# Patient Record
Sex: Female | Born: 1965
Health system: Southern US, Community
[De-identification: ages and names within clinical notes are randomized; demographics above are authoritative.]

## PROBLEM LIST (undated history)

## (undated) DIAGNOSIS — E8881 Metabolic syndrome: Secondary | ICD-10-CM

## (undated) DIAGNOSIS — E785 Hyperlipidemia, unspecified: Secondary | ICD-10-CM

## (undated) DIAGNOSIS — E119 Type 2 diabetes mellitus without complications: Secondary | ICD-10-CM

## (undated) DIAGNOSIS — J398 Other specified diseases of upper respiratory tract: Secondary | ICD-10-CM

## (undated) DIAGNOSIS — Z9889 Other specified postprocedural states: Secondary | ICD-10-CM

## (undated) DIAGNOSIS — I10 Essential (primary) hypertension: Secondary | ICD-10-CM

## (undated) DIAGNOSIS — R112 Nausea with vomiting, unspecified: Secondary | ICD-10-CM

## (undated) HISTORY — PX: TONSILLECTOMY: SUR1361

## (undated) HISTORY — PX: OTHER SURGICAL HISTORY: SHX169

## (undated) HISTORY — PX: ADENOIDECTOMY: SUR15

## (undated) HISTORY — DX: Other specified diseases of upper respiratory tract: J39.8

## (undated) HISTORY — DX: Type 2 diabetes mellitus without complications: E11.9

## (undated) HISTORY — DX: Metabolic syndrome: E88.810

## (undated) HISTORY — PX: VAGINAL HYSTERECTOMY: SUR661

## (undated) HISTORY — DX: Metabolic syndrome: E88.81

## (undated) HISTORY — DX: Hyperlipidemia, unspecified: E78.5

## (undated) HISTORY — PX: TUBAL LIGATION: SHX77

## (undated) HISTORY — PX: CLEFT PALATE REPAIR: SUR1165

## (undated) HISTORY — DX: Essential (primary) hypertension: I10

---

## 1998-09-04 ENCOUNTER — Other Ambulatory Visit: Admission: RE | Admit: 1998-09-04 | Discharge: 1998-09-04 | Payer: Self-pay | Admitting: Obstetrics and Gynecology

## 2000-10-27 ENCOUNTER — Other Ambulatory Visit: Admission: RE | Admit: 2000-10-27 | Discharge: 2000-10-27 | Payer: Self-pay | Admitting: Obstetrics and Gynecology

## 2001-11-20 ENCOUNTER — Other Ambulatory Visit: Admission: RE | Admit: 2001-11-20 | Discharge: 2001-11-20 | Payer: Self-pay | Admitting: Obstetrics and Gynecology

## 2003-02-28 ENCOUNTER — Other Ambulatory Visit: Admission: RE | Admit: 2003-02-28 | Discharge: 2003-02-28 | Payer: Self-pay | Admitting: Obstetrics and Gynecology

## 2003-05-06 ENCOUNTER — Observation Stay (HOSPITAL_COMMUNITY): Admission: RE | Admit: 2003-05-06 | Discharge: 2003-05-07 | Payer: Self-pay | Admitting: Obstetrics and Gynecology

## 2003-05-06 ENCOUNTER — Encounter (INDEPENDENT_AMBULATORY_CARE_PROVIDER_SITE_OTHER): Payer: Self-pay | Admitting: Specialist

## 2003-05-12 ENCOUNTER — Observation Stay (HOSPITAL_COMMUNITY): Admission: AD | Admit: 2003-05-12 | Discharge: 2003-05-13 | Payer: Self-pay | Admitting: Obstetrics and Gynecology

## 2003-05-23 ENCOUNTER — Inpatient Hospital Stay (HOSPITAL_COMMUNITY): Admission: AD | Admit: 2003-05-23 | Discharge: 2003-05-27 | Payer: Self-pay | Admitting: Obstetrics and Gynecology

## 2003-05-25 ENCOUNTER — Encounter: Payer: Self-pay | Admitting: Obstetrics and Gynecology

## 2004-04-04 ENCOUNTER — Other Ambulatory Visit: Admission: RE | Admit: 2004-04-04 | Discharge: 2004-04-04 | Payer: Self-pay | Admitting: Obstetrics and Gynecology

## 2005-04-19 ENCOUNTER — Other Ambulatory Visit: Admission: RE | Admit: 2005-04-19 | Discharge: 2005-04-19 | Payer: Self-pay | Admitting: Obstetrics and Gynecology

## 2008-05-16 ENCOUNTER — Ambulatory Visit: Payer: Self-pay | Admitting: Cardiovascular Disease

## 2008-05-17 ENCOUNTER — Observation Stay (HOSPITAL_COMMUNITY): Admission: EM | Admit: 2008-05-17 | Discharge: 2008-05-17 | Payer: Self-pay | Admitting: Emergency Medicine

## 2008-05-23 ENCOUNTER — Encounter: Payer: Self-pay | Admitting: Cardiology

## 2008-05-23 ENCOUNTER — Ambulatory Visit: Payer: Self-pay | Admitting: Cardiology

## 2008-05-23 DIAGNOSIS — R002 Palpitations: Secondary | ICD-10-CM

## 2008-05-23 DIAGNOSIS — I10 Essential (primary) hypertension: Secondary | ICD-10-CM

## 2008-05-31 ENCOUNTER — Encounter: Payer: Self-pay | Admitting: Cardiology

## 2008-05-31 ENCOUNTER — Ambulatory Visit: Payer: Self-pay

## 2008-06-04 DIAGNOSIS — E785 Hyperlipidemia, unspecified: Secondary | ICD-10-CM | POA: Insufficient documentation

## 2008-06-04 DIAGNOSIS — F411 Generalized anxiety disorder: Secondary | ICD-10-CM | POA: Insufficient documentation

## 2008-06-09 ENCOUNTER — Emergency Department (HOSPITAL_COMMUNITY): Admission: EM | Admit: 2008-06-09 | Discharge: 2008-06-09 | Payer: Self-pay | Admitting: Emergency Medicine

## 2008-08-23 ENCOUNTER — Ambulatory Visit: Payer: Self-pay | Admitting: Cardiology

## 2008-12-16 ENCOUNTER — Encounter (INDEPENDENT_AMBULATORY_CARE_PROVIDER_SITE_OTHER): Payer: Self-pay | Admitting: *Deleted

## 2009-02-21 ENCOUNTER — Ambulatory Visit: Payer: Self-pay | Admitting: Cardiology

## 2009-02-28 ENCOUNTER — Emergency Department (HOSPITAL_COMMUNITY): Admission: EM | Admit: 2009-02-28 | Discharge: 2009-02-28 | Payer: Self-pay | Admitting: Emergency Medicine

## 2009-02-28 ENCOUNTER — Telehealth: Payer: Self-pay | Admitting: Nurse Practitioner

## 2009-04-17 ENCOUNTER — Ambulatory Visit: Payer: Self-pay | Admitting: Gastroenterology

## 2009-04-17 DIAGNOSIS — R1013 Epigastric pain: Secondary | ICD-10-CM

## 2009-04-17 DIAGNOSIS — K3189 Other diseases of stomach and duodenum: Secondary | ICD-10-CM | POA: Insufficient documentation

## 2009-04-18 ENCOUNTER — Ambulatory Visit: Payer: Self-pay | Admitting: Gastroenterology

## 2009-04-24 ENCOUNTER — Telehealth: Payer: Self-pay | Admitting: Gastroenterology

## 2009-10-24 ENCOUNTER — Telehealth: Payer: Self-pay | Admitting: Cardiology

## 2010-03-01 IMAGING — CR DG CHEST 2V
2 series · 2 of 2 positions shown · non-contrast
Comparison: No priors

CLINICAL DATA: Hypertension

CHEST - 2 VIEW

[w chest pa]
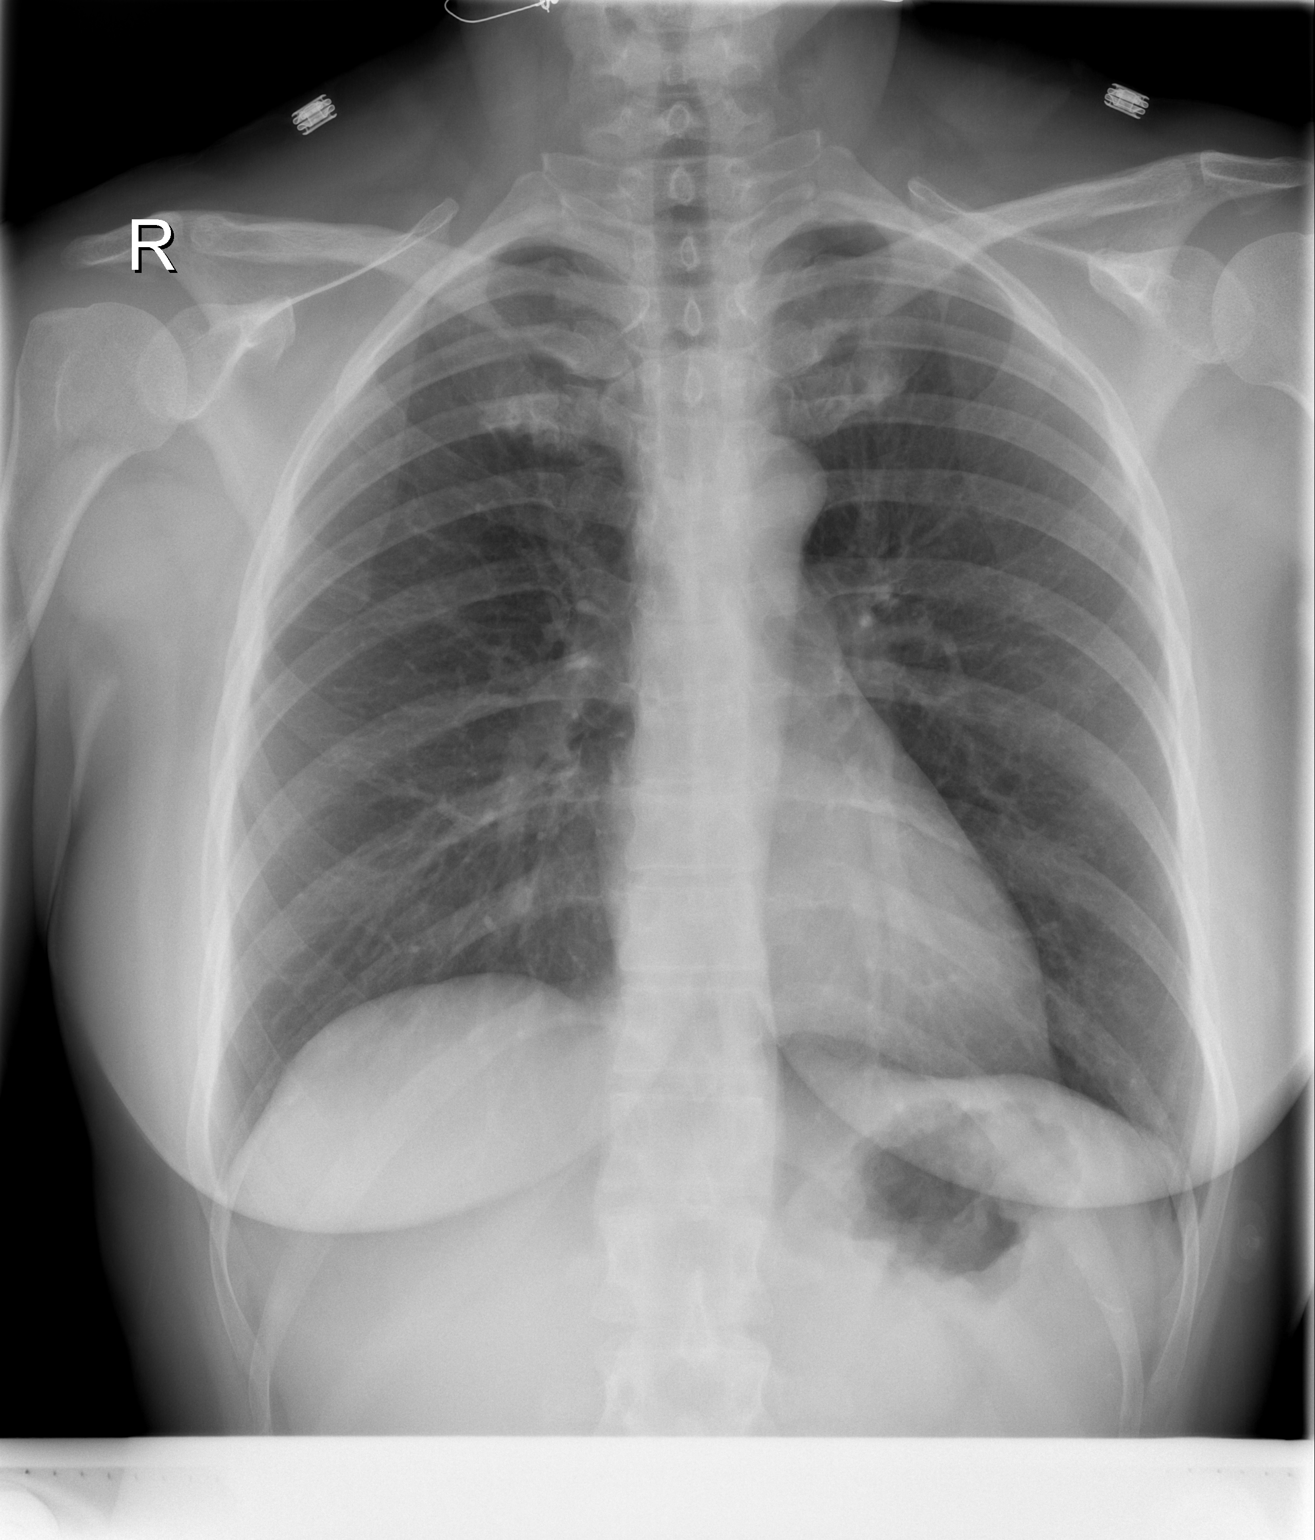

[w chest lat]
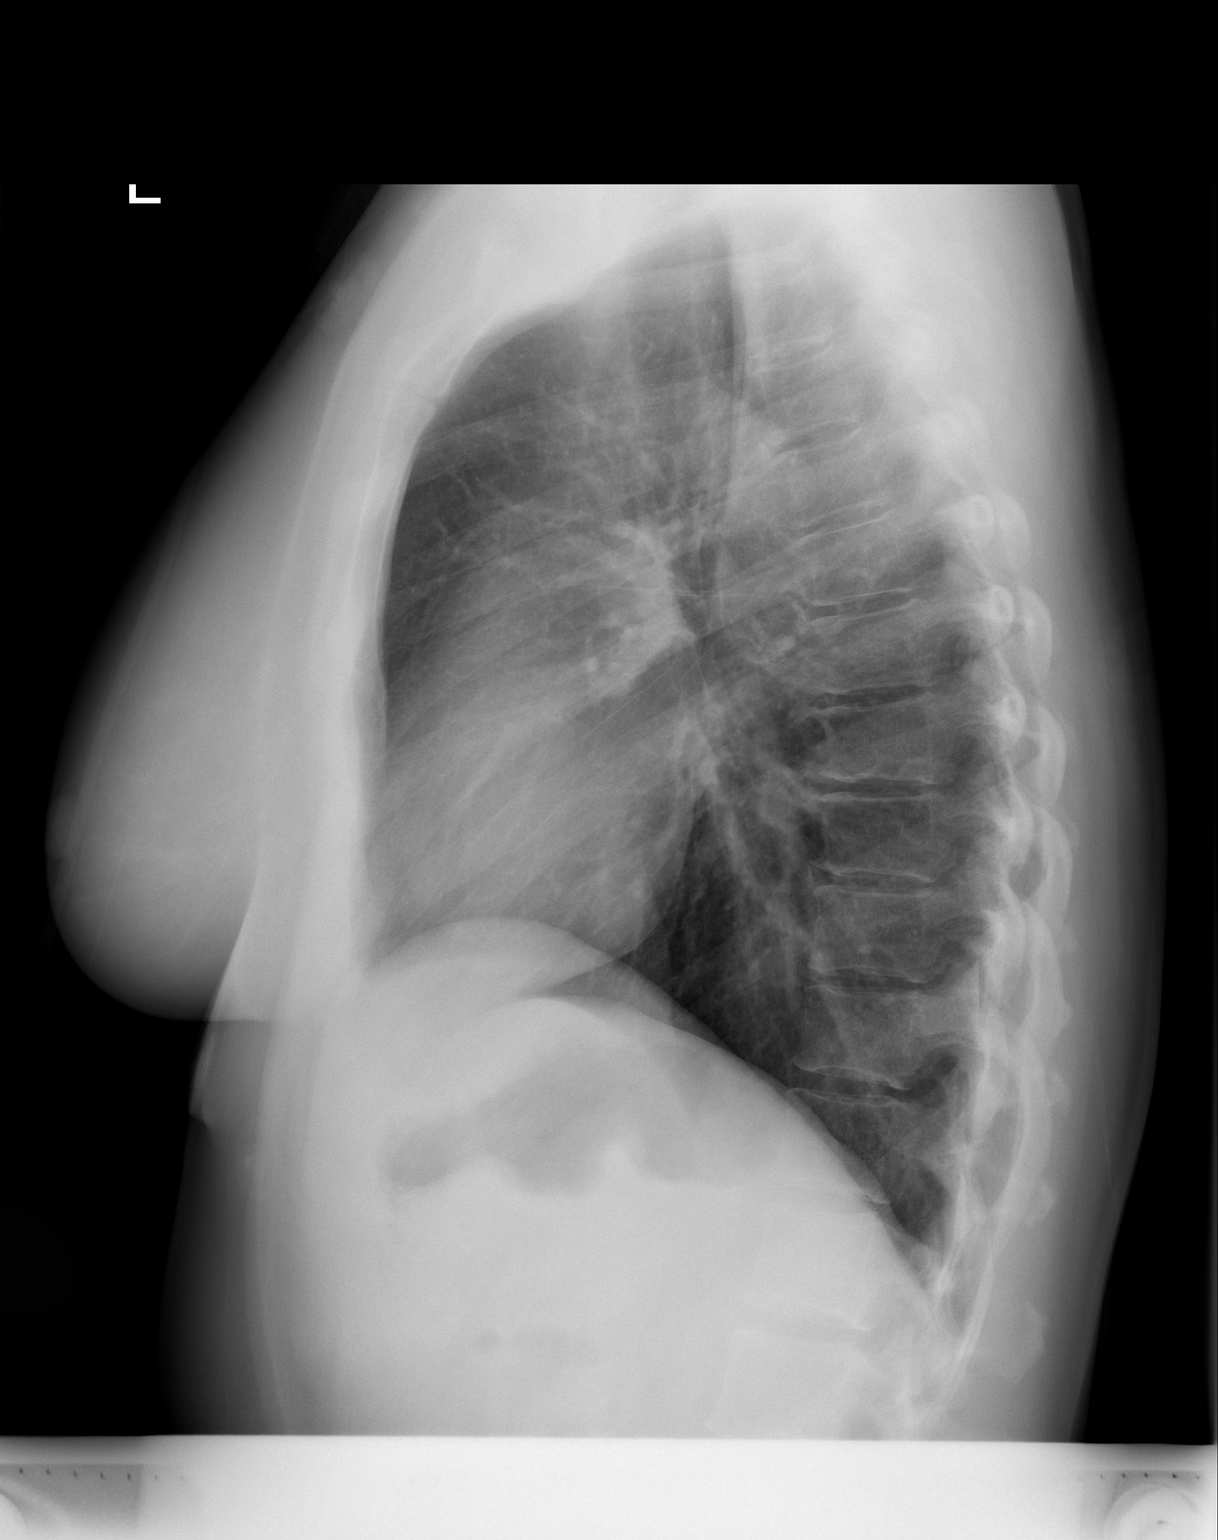

[2 of 2 positions shown; findings below may reference images not displayed]

FINDINGS: The heart size and mediastinal contours are within normal
limits.  Both lungs are clear.  The visualized skeletal structures
are unremarkable.
IMPRESSION: No active cardiopulmonary disease.

## 2010-03-01 IMAGING — CT CT HEAD W/O CM
1 of 2 series · 13 of 30 positions shown, 17 images · non-contrast
Comparison: None

CLINICAL DATA: Headache with nausea

CT HEAD WITHOUT CONTRAST
TECHNIQUE: Contiguous axial images were obtained from the base of
the skull through the vertex without contrast.

[Series 2: brain · axial · 0.47mm/px · z∈[+151,+287]mm · 13 of 32 slices shown, 17 images]
[im 3/32  brain]
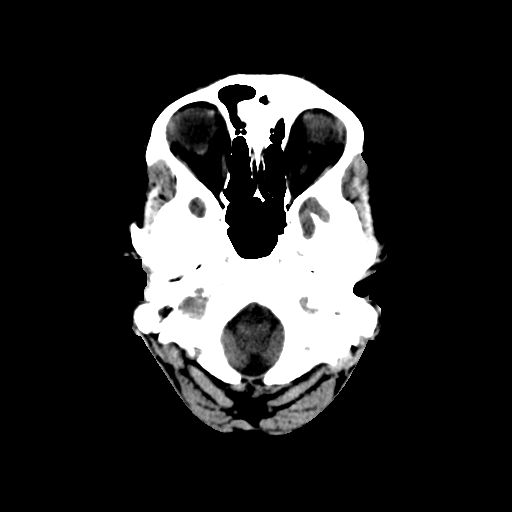
[im 3/32  bone]
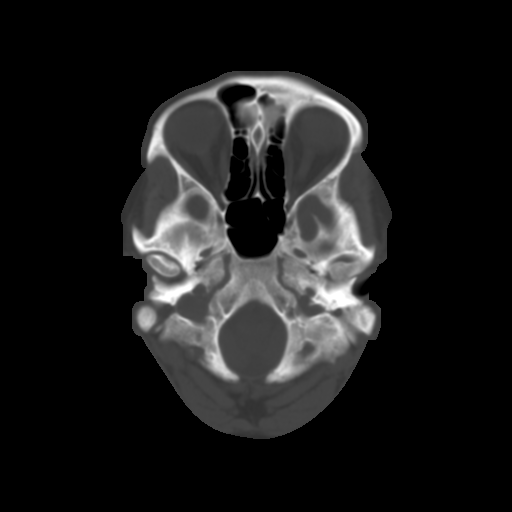
[im 5/32  brain]
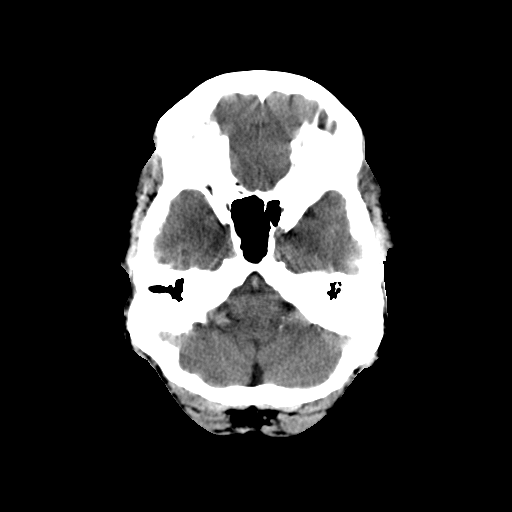
[im 7/32  brain]
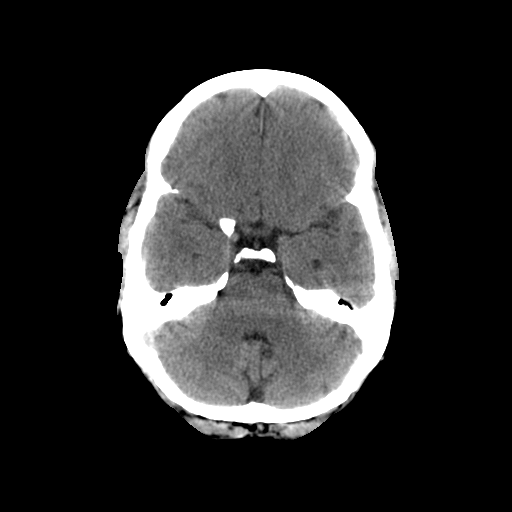
[im 9/32  brain]
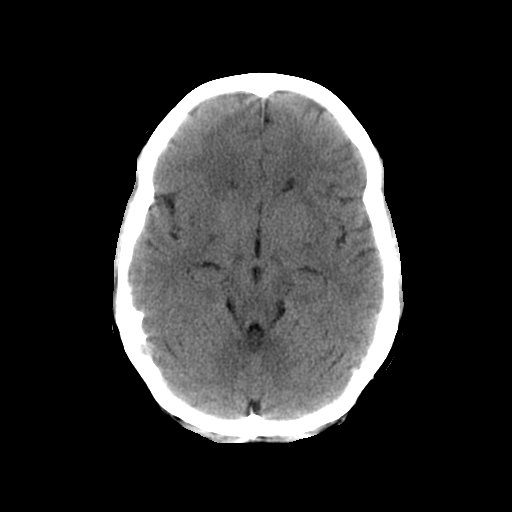
[im 12/32  brain]
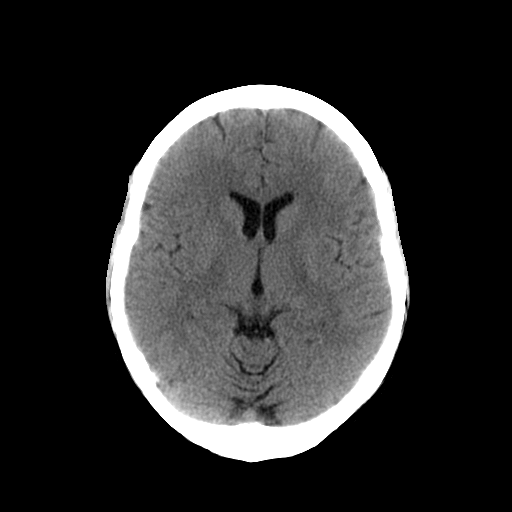
[im 12/32  bone]
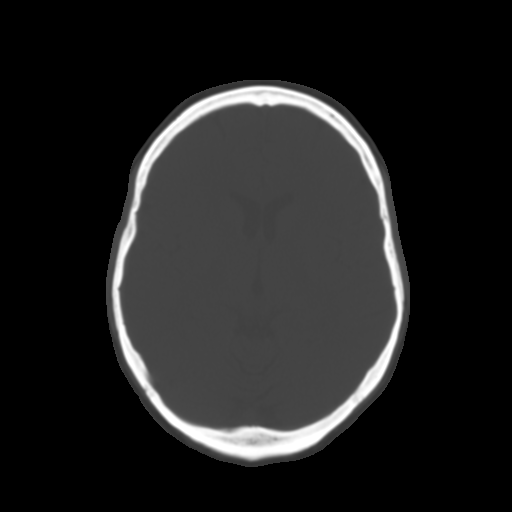
[im 14/32  brain]
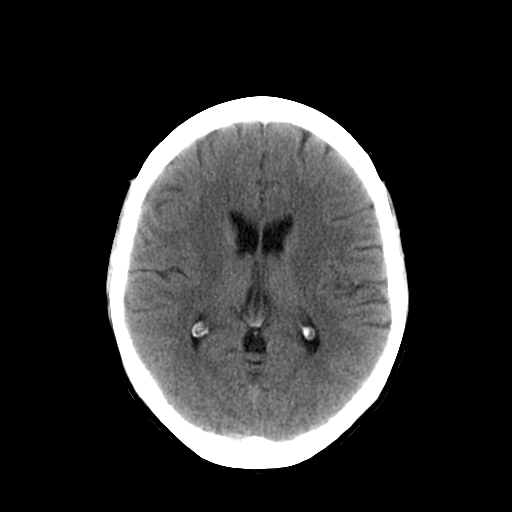
[im 16/32  brain]
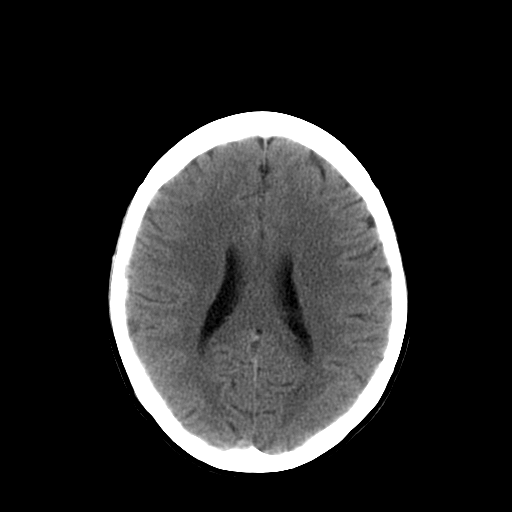
[im 18/32  brain]
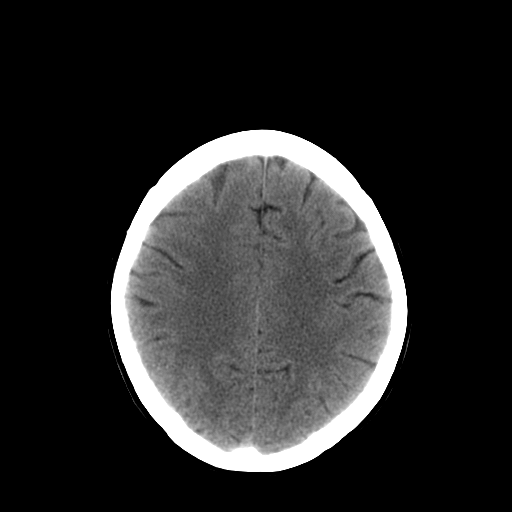
[im 20/32  brain]
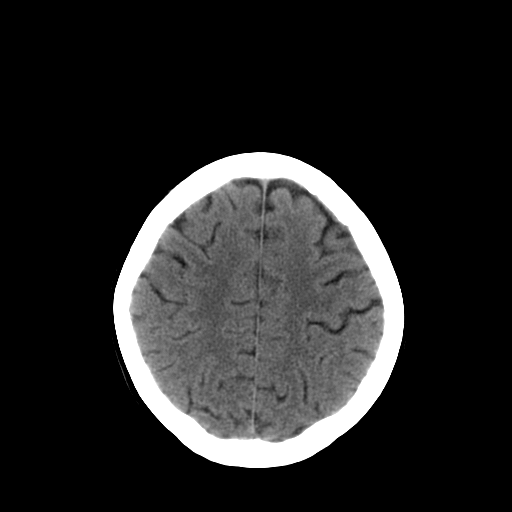
[im 20/32  bone]
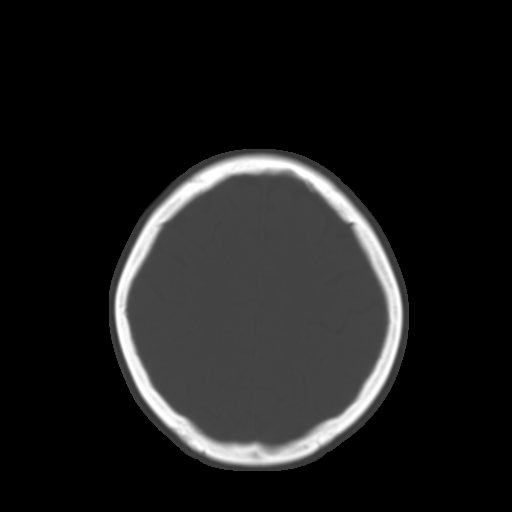
[im 23/32  brain]
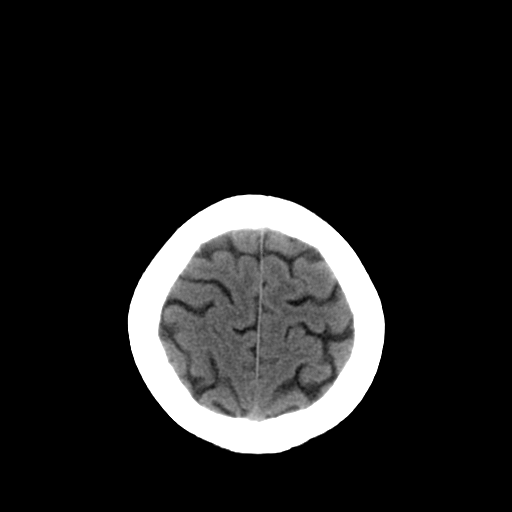
[im 25/32  brain]
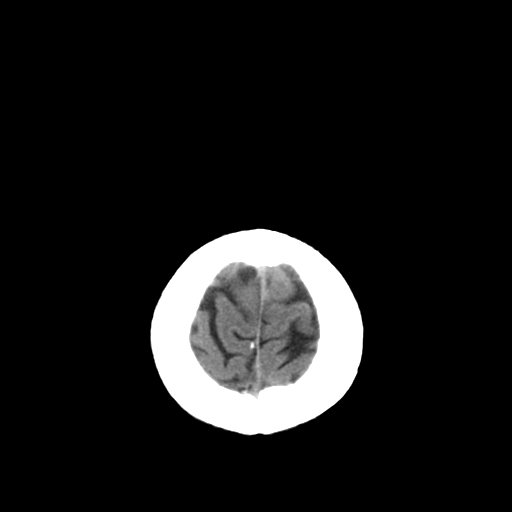
[im 27/32  brain]
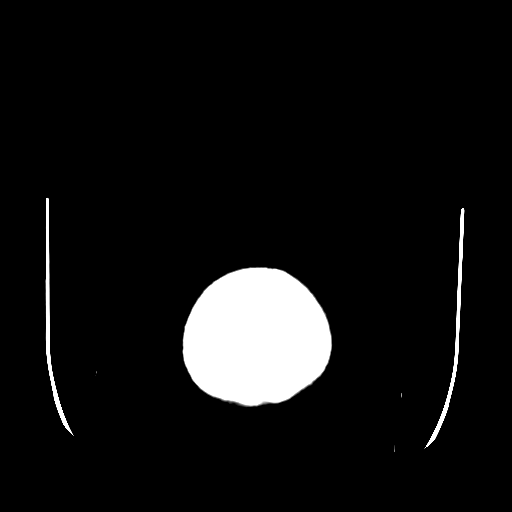
[im 29/32  brain]
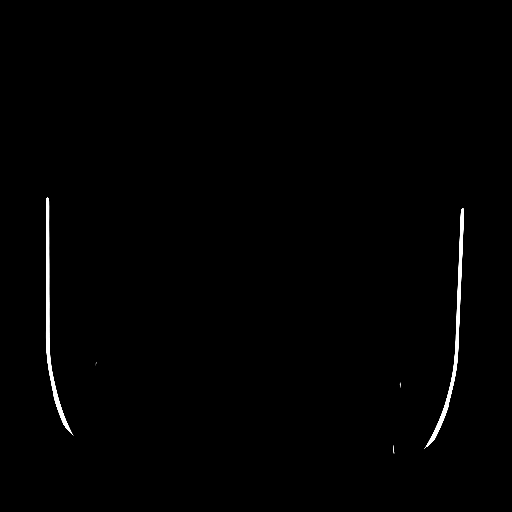
[im 29/32  bone]
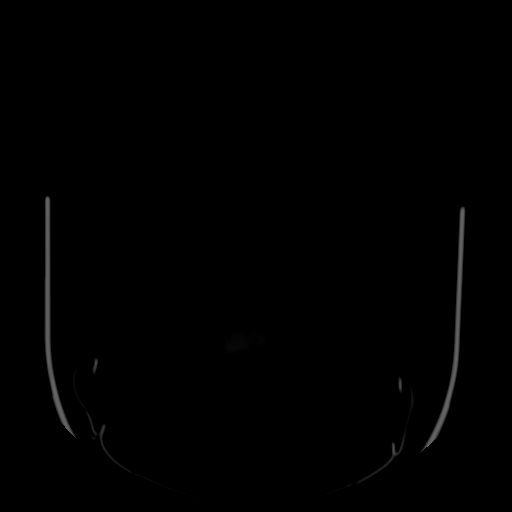

[13 of 30 positions shown; findings below may reference images not displayed]

FINDINGS: The cerebral and cerebellar parenchyma are symmetric.  No
acute intracranial hemorrhage or edema.  The ventricles and basilar
cisterns are midline without effacement or mass effect.  Chronic
changes seen within the mastoid air cells.  The sinuses are clear.
IMPRESSION: No acute intracranial hemorrhage or edema.

Diffuse increase sclerosis mastoid air cells.

## 2010-03-20 NOTE — Progress Notes (Signed)
Summary: Aciphex not at Rx  Phone Note Call from Patient Call back at Work Phone [phone]   Call For: DR Arlyce Dice Reason for Call: Talk to Nurse Summary of Call: Aciphex is not at the drugstore-was supposed to do  last wed. [facility]ses [facility] on [address] Initial call taken by: Leanor Kail [facility],  April 24, 2009 10:43 AM  Follow-up for Phone Call        Called pt to inform that med was sent Follow-up by: Merri Ray CMA Duncan Dull),  April 24, 2009 11:56 AM    New/Updated Medications: ACIPHEX 20 MG TBEC (RABEPRAZOLE SODIUM) 1 by mouth once daily Prescriptions: ACIPHEX 20 MG TBEC (RABEPRAZOLE SODIUM) 1 by mouth once daily  #30 x 3   Entered by:   Merri Ray CMA (AAMA)   Authorized by:   Louis Meckel MD   Signed by:   Merri Ray CMA (AAMA) on 04/24/2009   Method used:   Electronically to        [facility] [address] (retail)       [address]       [place], [place]  [address]       Ph: [phone] or [phone]       Fax: [phone]   RxID:   [id number]

## 2010-03-20 NOTE — Progress Notes (Signed)
Summary: rtn debbie call  Phone Note Call from Patient   Caller: Patient Reason for Call: Talk to Nurse Summary of Call: pt rtn call to debbie-pls call [phone] Initial call taken by: Glynda Jaeger,  October 24, 2009 2:21 PM  Follow-up for Phone Call        Debbie had instructed pt to take her BP meds this am and call back with her BP results/ pt had taken BP meds last night as well.  pt called back with BP results 122/79 at 2 pm.  Pt would like to know how she should take her medications from now on.  Instructed pt I will forward this information to Dr Daleen Squibb and Eunice Blase for review and she will be called back.  She is aware of the above and will take her medications in the am. Follow-up by: Charolotte Capuchin, RN,  October 24, 2009 3:05 PM  Additional Follow-up for Phone Call Additional follow up Details #1::        agree. Additional Follow-up by: Gaylord Shih, MD, [facility],  October 25, 2009 9:20 AM

## 2010-03-20 NOTE — Assessment & Plan Note (Signed)
Summary: F6M/ANAS   Visit Type:  6 mo f/u Primary Provider:  DR.PICKARD  CC:  no cardiac complaints today.  History of Present Illness: [name] returns for close follow up of her hypertension. On last visit, we increased her amlodipine to 5 mg per day. Her blood pressures and better. She denies any edema. Her headaches have resolved.  Current Medications (verified): 1)  Metoprolol Tartrate 50 Mg Tabs (Metoprolol Tartrate) .Marland Kitchen.. 1 Tab Two Times A Day 2)  Ibuprofen 800 Mg Tabs (Ibuprofen) .... Use As Directed.as Needed 3)  Amlodipine Besylate 5 Mg Tabs (Amlodipine Besylate) .... Take One Tablet By Mouth Daily  Allergies (verified): No Known Drug Allergies  Past History:  Past Medical History: Last updated: 06/04/2008 PALPITATIONS (ICD-785.1) HYPERLIPIDEMIA-MIXED (ICD-272.4) HYPERTENSION, BENIGN (ICD-401.1) ANXIETY (ICD-300.00)    Past Surgical History: Last updated: 06/04/2008 laparoscopically-assisted vaginal hysterectomy with Dr. Arelia Sneddon...05/06/2003  Family History: Last updated: 06/04/2008 Neg for premature CAD  Social History: Last updated: 06/04/2008 Tobacco Use - No.  Alcohol Use - no Drug Use - no  Risk Factors: Smoking Status: never (06/04/2008)  Review of Systems       negative other than history of present illness  Vital Signs:  Patient profile:   45 year old female Height:      63 inches Weight:      157 pounds BMI:     27.91 Pulse rate:   76 / minute Pulse rhythm:   regular BP sitting:   126 / 80  (left arm) Cuff size:   large  Vitals Entered By: Danielle Rankin, CMA (February 21, 2009 2:18 PM)  Physical Exam  General:  overweight Head:  normocephalic and atraumatic Eyes:  PERRLA/EOM intact; conjunctiva and lids normal. Mouth:  Teeth, gums and palate normal. Oral mucosa normal. Neck:  Neck supple, no JVD. No masses, thyromegaly or abnormal cervical nodes. Lungs:  Clear bilaterally to auscultation and percussion. Heart:  no S4 gout Msk:   decreased ROM.   Pulses:  pulses normal in all 4 extremities Extremities:  No clubbing or cyanosis. Neurologic:  Alert and oriented x 3. Skin:  Intact without lesions or rashes. Psych:  Normal affect.   Impression & Recommendations:  Problem # 1:  HYPERTENSION, BENIGN (ICD-401.1) Assessment Improved I am very pleased with her blood pressure response. She feels better with less occipital headaches. Encourage walking and weight loss. I will see her back in a year. Her updated medication list for this problem includes:    Metoprolol Tartrate 50 Mg Tabs (Metoprolol tartrate) .Marland Kitchen... 1 tab two times a day    Amlodipine Besylate 5 Mg Tabs (Amlodipine besylate) .Marland Kitchen... Take one tablet by mouth daily

## 2010-03-20 NOTE — Progress Notes (Signed)
Summary: Cardiology Phone Note - flu-like Ss  Phone Note Call from Patient   Caller: Patient Summary of Call: Received call from patient stating that over the past few days she's been experiencing flu-like Ss including subj. fevers, chills and joint aching.  She's wondering if this could be related to her lopressor or norvasc.  She has been on these meds for sometime and has tolerated them.  I advised that this sounds more consistent with a viral/flu-like illness and that she should hydrate adequately and control Ss as needed with as needed otc acetaminophen.  She was grateful for the call back. Initial call taken by: Creig Hines, ANP-BC,  February 28, 2009 10:31 AM

## 2010-03-20 NOTE — Letter (Signed)
Summary: EGD Instructions  [facility]  [address] [place], [place] [address]   Phone: [phone]  Fax: [phone]       [name]    [birth year]    MRN: [id number]       Procedure Day /Date:TUESDAY 04/18/2009     Arrival Time:3PM     Procedure Time:4PM     Location of Procedure:                    X  [facility] (4th Floor)    PREPARATION FOR ENDOSCOPY   On 04/18/2009  THE DAY OF THE PROCEDURE:  1.   No solid foods, milk or milk products are allowed after midnight the night before your procedure.  2.   Do not drink anything colored red or purple.  Avoid juices with pulp.  No orange juice.  3.  You may drink clear liquids until 2PM  which is 2 hours before your procedure.                                                                                                CLEAR LIQUIDS INCLUDE: Water Jello Ice Popsicles Tea (sugar ok, no milk/cream) Powdered fruit flavored drinks Coffee (sugar ok, no milk/cream) Gatorade Juice: apple, white grape, white cranberry  Lemonade Clear bullion, consomm, broth Carbonated beverages (any kind) Strained chicken noodle soup Hard Candy   MEDICATION INSTRUCTIONS  Unless otherwise instructed, you should take regular prescription medications with a small sip of water as early as possible the morning of your procedure.           OTHER INSTRUCTIONS  You will need a responsible adult at least 47 years of age to accompany you and drive you home.   This person must remain in the waiting room during your procedure.  Wear loose fitting clothing that is easily removed.  Leave jewelry and other valuables at home.  However, you may wish to bring a book to read or an iPod/MP3 player to listen to music as you wait for your procedure to start.  Remove all body piercing jewelry and leave at home.  Total time from sign-in until discharge is approximately 2-3 hours.  You should go home directly after your  procedure and rest.  You can resume normal activities the day after your procedure.  The day of your procedure you should not:   Drive   Make legal decisions   Operate machinery   Drink alcohol   Return to work  You will receive specific instructions about eating, activities and medications before you leave.    The above instructions have been reviewed and explained to me by   _______________________    I fully understand and can verbalize these instructions _____________________________ Date _________

## 2010-03-20 NOTE — Progress Notes (Signed)
Summary: Ins does not want to pay for Aciphex  Phone Note Call from Patient Call back at [phone]   Call For: Dr Arlyce Dice Summary of Call: Aciphex is wanting her to try something else. Wonders if we want to order Nexium which is what her insurance wants her to use or supply her with samples of the aciphex? Initial call taken by: Leanor Kail [facility],  April 24, 2009 1:47 PM  Follow-up for Phone Call        Insurance will not pay for Aciphex wants her to try Nexium. Sent in script for Nexium. Called pt to inform Follow-up by: Merri Ray CMA Duncan Dull),  April 24, 2009 1:51 PM    New/Updated Medications: NEXIUM 40 MG CPDR (ESOMEPRAZOLE MAGNESIUM) 1 by mouth once daily Prescriptions: NEXIUM 40 MG CPDR (ESOMEPRAZOLE MAGNESIUM) 1 by mouth once daily  #30 x 3   Entered by:   Merri Ray CMA (AAMA)   Authorized by:   Louis Meckel MD   Signed by:   Merri Ray CMA (AAMA) on 04/24/2009   Method used:   Electronically to        [facility] [address] (retail)       [address]       [place], [place]  [address]       Ph: [phone] or [phone]       Fax: [phone]   RxID:   [id number]

## 2010-03-20 NOTE — Procedures (Signed)
Summary: Upper Endoscopy  Patient: [name] Note: All result statuses are Final unless otherwise noted.  Tests: (1) Upper Endoscopy (EGD)   EGD Upper Endoscopy       DONE     [facility]     [address]     [place], [place]  [address]           ENDOSCOPY PROCEDURE REPORT           PATIENT:  [name], [name]  MR#:  [id number]     BIRTHDATE:  1965-07-02, 43 yrs. old  GENDER:  female           ENDOSCOPIST:  Barbette Hair. Arlyce Dice, MD     Referred by:           PROCEDURE DATE:  04/18/2009     PROCEDURE:  EGD, diagnostic     ASA CLASS:  Class II     INDICATIONS:  abdominal pain           MEDICATIONS:   Fentanyl 50 mcg IV, Versed 7 mg IV, glycopyrrolate     (Robinal) 0.2 mg IV, 0.6cc simethancone 0.6 cc PO     TOPICAL ANESTHETIC:  Exactacain Spray           DESCRIPTION OF PROCEDURE:   After the risks benefits and     alternatives of the procedure were thoroughly explained, informed     consent was obtained.  The LB [id number] endoscope was     introduced through the mouth and advanced to the third portion of     the duodenum, without limitations.  The instrument was slowly     withdrawn as the mucosa was fully examined.     <<PROCEDUREIMAGES>>           normal otherwise (see image1, image2, image3, image4, image5,     image6, image7, and image8).    Retroflexed views revealed no     abnormalities.    The scope was then withdrawn from the patient     and the procedure completed.           COMPLICATIONS:  None           ENDOSCOPIC IMPRESSION:     1) Normal     RECOMMENDATIONS:     1) continue PPI - aciphex     2) Call office next 2-3 days to schedule an office appointment     for 2-3 weeks; call if you develop recurrent nausea, vomiting and     pain           REPEAT EXAM:  No           ______________________________     Barbette Hair. Arlyce Dice, MD           CC:  Gilmore Laroche MD           n.     Rosalie DoctorBarbette Hair. Kaplan at 04/18/2009 04:35 PM        Lillie Columbia, [phone]  Note: An exclamation mark (!) indicates a result that was not dispersed into the flowsheet. Document Creation Date: 04/18/2009 4:35 PM _______________________________________________________________________  (1) Order result status: Final Collection or observation date-time: 04/18/2009 16:30 Requested date-time:  Receipt date-time:  Reported date-time:  Referring Physician:   Ordering Physician: Melvia Heaps [phone]) Specimen Source:  Source: Launa Grill Order Number: [id number] Lab site:

## 2010-03-20 NOTE — Assessment & Plan Note (Signed)
Summary: possible gallbladder issues...em   History of Present Illness Visit Type: Initial Visit Primary GI MD: Melvia Heaps MD [facility] Primary Provider: Gilmore Laroche, MD Chief Complaint: Patient complains of acid reflux that started in Jan but has been better with omperazole. She states that on Wednesday of last week she developed sharp epigastric pain and nausea which led to vomiting. She then had diarrhea.  History of Present Illness:   [name] is a pleasant 45 year old white female referred at the request of Dr. Tanya Nones for evaluation of nausea, vomiting and pyrosis.  About a month ago she had a 24-hour period of severe nausea with protracted vomiting followed by diarrhea.  This subsided but she had persistent heartburn for which she was taking omeprazole.  3 weeks later she had a similar 24 hour episode of recurrent nausea vomiting and diarrhea.  This time she had upper epigastric pain that was relieved with vomiting.  She has also had some pain in the area for right shoulder.  She is on no gastric irritants including nonsteroidals.  She is without fever, joint or muscle pains.   She has no antecedent GI complaints.   GI Review of Systems    Reports abdominal pain, acid reflux, bloating, heartburn, nausea, and  vomiting.     Location of  Abdominal pain: epigastric area.    Denies belching, chest pain, dysphagia with liquids, dysphagia with solids, loss of appetite, vomiting blood, weight loss, and  weight gain.      Reports diarrhea.     Denies anal fissure, black tarry stools, change in bowel habit, constipation, diverticulosis, fecal incontinence, heme positive stool, hemorrhoids, irritable bowel syndrome, jaundice, light color stool, liver problems, rectal bleeding, and  rectal pain.    Current Medications (verified): 1)  Metoprolol Tartrate 50 Mg Tabs (Metoprolol Tartrate) .Marland Kitchen.. 1 Tab Two Times A Day 2)  Lisinopril 20 Mg Tabs (Lisinopril) .... Take One By Mouth Once Daily 3)   Omeprazole 20 Mg Cpdr (Omeprazole) .... Take One By Mouth Once Daily  Allergies (verified): No Known Drug Allergies  Past History:  Past Medical History: Last updated: 06/04/2008 PALPITATIONS (ICD-785.1) HYPERLIPIDEMIA-MIXED (ICD-272.4) HYPERTENSION, BENIGN (ICD-401.1) ANXIETY (ICD-300.00)    Past Surgical History: laparoscopically-assisted vaginal hysterectomy with bladder sling with Dr. Arelia Sneddon...05/06/2003 Tonsillectomy tubes in ears cleft pallet repaired D&C  Family History: Neg for premature CAD Family History of Colon Polyps:mother Family History of UC: mother Family History of Diabetes: father Family History of Heart Disease: paternal grandmother Family History of Lung Cancer: Maternal Grandmother (nonsmoker)  Social History: Tobacco Use - No.  Alcohol Use - no Drug Use - no Daily Caffeine Use one per day Occupation: Print production planner married 2 girls  Review of Systems       The patient complains of allergy/sinus, back pain, cough, fatigue, headaches-new, and urine leakage.  The patient denies anemia, anxiety-new, arthritis/joint pain, blood in urine, breast changes/lumps, change in vision, confusion, coughing up blood, depression-new, fainting, fever, hearing problems, heart murmur, heart rhythm changes, itching, menstrual pain, muscle pains/cramps, night sweats, nosebleeds, pregnancy symptoms, shortness of breath, skin rash, sleeping problems, sore throat, swelling of feet/legs, swollen lymph glands, thirst - excessive , urination - excessive , urination changes/pain, vision changes, and voice change.         All other systems were reviewed and were negative   Vital Signs:  Patient profile:   45 year old female Height:      63 inches Weight:      156.2 pounds BMI:  27.77 Pulse rate:   76 / minute Pulse rhythm:   regular BP sitting:   142 / 80  (left arm) Cuff size:   regular  Vitals Entered By: Harlow Mares CMA Duncan Dull) (April 17, 2009 11:26  AM)  Physical Exam  Additional Exam:  She is a healthy-appearing female  skin: anicteric HEENT: normocephalic; PEERLA; no nasal or pharyngeal abnormalities neck: supple nodes: no cervical lymphadenopathy chest: clear to ausculatation and percussion heart: no murmurs, gallops, or rubs abd: soft, nontender; BS normoactive; no abdominal masses, tenderness, organomegaly rectal: deferred ext: no cynanosis, clubbing, edema skeletal: no deformities neuro: oriented x 3; no focal abnormalities    Impression & Recommendations:  Problem # 1:  DYSPEPSIA&OTHER SPEC DISORDERS FUNCTION STOMACH (ICD-536.8) Assessment New  Symptoms could be due to recurrent gastroenteritis.  Ongoing GERD with a without active peptic ulcer disease is another concerns.  Chronic cholecystitis is less likely.  Recommendations #1 trial of AcipHex 20 mg daily #2 upper endoscopy #3 to consider abdominal ultrasound if she has not improved  Risks, alternatives, and complications of the procedure, including bleeding, perforation, and possible need for surgery, were explained to the patient.  Patient's questions were answered.  Orders: EGD (EGD)  Patient Instructions: 1)  Conscious Sedation brochure given.  2)  Upper Endoscopy brochure given.  3)  Your EGD is scheduled for 04/18/2009  4)  We have given you samples of Aciphex today 5)  CC Dr. Gilmore Laroche 6)  The medication list was reviewed and reconciled.  All changed / newly prescribed medications were explained.  A complete medication list was provided to the patient / caregiver.

## 2010-03-20 NOTE — Progress Notes (Signed)
Summary: pt meds are not controlling bp  Phone Note Call from Patient Call back at [phone]   Caller: pt Reason for Call: Talk to Nurse Summary of Call: Pt medication for her blood pressure is not controlling it. Initial call taken by: Faythe Ghee,  October 24, 2009 8:42 AM  Follow-up for Phone Call        Pt calls today b/c blood pressure was 148/100 last pm.  She says bp has been running high for the past 2 weeks. She  also has had a  headache in the occipital region with no visual disturbances noted.  She saw her pcp Dr. Tanya Nones at [facility] a few months ago for b/p and added  Doxazosin 4mg  daily. She states that she has been under a lot of stress lately as her father passed in March.  She will see Dr. Tanya Nones on Thursday.  She has not taken her b/p meds this am --will check bp and call back. Mylo Red RN Pt returns call with bp 148/106.  She will take her Cardura and Lisinopril now.  She will call back this afternoon with blood pressure.  She does not report any headache or dizziness at this time and she is at work. Mylo Red RN    New/Updated Medications: CARDURA 4 MG TABS (DOXAZOSIN MESYLATE) Take 1 tablet daily  Appended Document: pt meds are not controlling bp follow up with Dr Tanya Nones...let us know if we can help.  Appended Document: pt meds are not controlling bp Pt aware and will follow-up with Dr. Tanya Nones. Mylo Red RN

## 2010-03-20 NOTE — Letter (Signed)
Summary: Results Letter  [facility]  [address] [place], [place] [address]   Phone: [phone]  Fax: [phone]        April 17, 2009 MRN: [id number]    [facility] [address] [place], [place]  [address]    Dear Ms. Teall,  It is my pleasure to have treated you recently as a new patient in my office. I appreciate your confidence and the opportunity to participate in your care.  Since I do have a busy inpatient endoscopy schedule and office schedule, my office hours vary weekly. I am, however, available for emergency calls everyday through my office. If I am not available for an urgent office appointment, another one of our gastroenterologist will be able to assist you.  My well-trained staff are prepared to help you at all times. For emergencies after office hours, a physician from our Gastroenterology section is always available through my 24 hour answering service  Once again I welcome you as a new patient and I look forward to a happy and healthy relationship             Sincerely,  Louis Meckel MD  This letter has been electronically signed by your physician.  Appended Document: Results Letter letter mailed

## 2010-05-06 LAB — DIFFERENTIAL
Eosinophils Absolute: 0 10*3/uL (ref 0.0–0.7)
Eosinophils Relative: 0 % (ref 0–5)
Lymphs Abs: 3.5 10*3/uL (ref 0.7–4.0)
Monocytes Relative: 5 % (ref 3–12)
Neutrophils Relative %: 67 % (ref 43–77)

## 2010-05-06 LAB — CBC
Hemoglobin: 14.8 g/dL (ref 12.0–15.0)
MCHC: 34.6 g/dL (ref 30.0–36.0)
MCV: 94.3 fL (ref 78.0–100.0)
RBC: 4.53 MIL/uL (ref 3.87–5.11)

## 2010-05-06 LAB — URINE MICROSCOPIC-ADD ON

## 2010-05-06 LAB — URINALYSIS, ROUTINE W REFLEX MICROSCOPIC
Glucose, UA: NEGATIVE mg/dL
Protein, ur: NEGATIVE mg/dL

## 2010-05-30 LAB — POCT I-STAT, CHEM 8
BUN: 14 mg/dL (ref 6–23)
Calcium, Ion: 1.15 mmol/L (ref 1.12–1.32)
Chloride: 104 mEq/L (ref 96–112)
Creatinine, Ser: 1 mg/dL (ref 0.4–1.2)
Glucose, Bld: 132 mg/dL — ABNORMAL HIGH (ref 70–99)
HCT: 41 % (ref 36.0–46.0)
Hemoglobin: 13.9 g/dL (ref 12.0–15.0)
Potassium: 3.7 mEq/L (ref 3.5–5.1)
Sodium: 138 mEq/L (ref 135–145)
TCO2: 26 mmol/L (ref 0–100)

## 2010-05-30 LAB — PROTIME-INR: INR: 1 (ref 0.00–1.49)

## 2010-05-31 LAB — COMPREHENSIVE METABOLIC PANEL
ALT: 31 U/L (ref 0–35)
AST: 16 U/L (ref 0–37)
CO2: 26 mEq/L (ref 19–32)
Chloride: 102 mEq/L (ref 96–112)
GFR calc Af Amer: >60 mL/min (ref >60)
GFR calc non Af Amer: >60 mL/min (ref >60)
Glucose, Bld: 127 mg/dL — ABNORMAL HIGH (ref 70–99)
Sodium: 139 mEq/L (ref 135–145)
Total Bilirubin: 1 mg/dL (ref 0.3–1.2)

## 2010-05-31 LAB — POCT CARDIAC MARKERS
CKMB, poc: <1 ng/mL — ABNORMAL LOW (ref 1.0–8.0)
CKMB, poc: <1 ng/mL — ABNORMAL LOW (ref 1.0–8.0)
Troponin i, poc: <0.05 ng/mL (ref 0.00–0.09)
Troponin i, poc: <0.05 ng/mL (ref 0.00–0.09)

## 2010-05-31 LAB — URINE CULTURE

## 2010-05-31 LAB — CBC
Hemoglobin: 14.7 g/dL (ref 12.0–15.0)
MCV: 95.6 fL (ref 78.0–100.0)
RBC: 4.52 MIL/uL (ref 3.87–5.11)
WBC: 16.8 10*3/uL — ABNORMAL HIGH (ref 4.0–10.5)

## 2010-05-31 LAB — CK TOTAL AND CKMB (NOT AT ARMC)
CK, MB: 0.7 ng/mL (ref 0.3–4.0)
Total CK: 49 U/L (ref 7–177)

## 2010-05-31 LAB — TROPONIN I: Troponin I: <0.01 ng/mL (ref 0.00–0.06)

## 2010-05-31 LAB — URINALYSIS, ROUTINE W REFLEX MICROSCOPIC
Ketones, ur: NEGATIVE mg/dL
Leukocytes, UA: NEGATIVE
Nitrite: NEGATIVE
Protein, ur: NEGATIVE mg/dL
Urobilinogen, UA: 0.2 mg/dL (ref 0.0–1.0)

## 2010-05-31 LAB — PROTIME-INR
INR: 0.9 (ref 0.00–1.49)
Prothrombin Time: 12.7 seconds (ref 11.6–15.2)

## 2010-05-31 LAB — POCT I-STAT, CHEM 8
Calcium, Ion: 1.09 mmol/L — ABNORMAL LOW (ref 1.12–1.32)
Chloride: 105 mEq/L (ref 96–112)
Glucose, Bld: 126 mg/dL — ABNORMAL HIGH (ref 70–99)
HCT: 43 % (ref 36.0–46.0)
Hemoglobin: 14.6 g/dL (ref 12.0–15.0)
Potassium: 3.8 mEq/L (ref 3.5–5.1)

## 2010-05-31 LAB — DIFFERENTIAL
Basophils Absolute: 0 10*3/uL (ref 0.0–0.1)
Basophils Relative: 0 % (ref 0–1)
Eosinophils Absolute: 0 10*3/uL (ref 0.0–0.7)
Eosinophils Relative: 0 % (ref 0–5)

## 2010-05-31 LAB — APTT: aPTT: 27 seconds (ref 24–37)

## 2010-05-31 LAB — URINE MICROSCOPIC-ADD ON

## 2010-07-03 NOTE — Discharge Summary (Signed)
[name], [name]               ACCOUNT NO.:  [id number]   MEDICAL RECORD NO.:  [id number]          PATIENT TYPE:  OBV   LOCATION:  2507                         FACILITY:  [facility]   PHYSICIAN:  Everardo Beals. Juanda Chance, MD, FACCDATE OF BIRTH:  October 20, 1965   DATE OF ADMISSION:  05/16/2008  DATE OF DISCHARGE:  05/17/2008                               DISCHARGE SUMMARY   PATIENT'S PRIMARY CARDIOLOGIST:  Maisie Fus C. Wall, MD, [facility]   DISCHARGE DIAGNOSIS:  Symptomatic preventricular complexes.   SECONDARY DIAGNOSES:  1. Anxiety.  2. Hypertension.  3. Hyperlipidemia.   ALLERGIES:  [place] NKDA.   PROCEDURES PERFORMED DURING THIS HOSPITALIZATION:  1. The patient had EKG showing sinus tachycardia with PVCs at the rate      of 102 beats per minute and no acute changes.  2. Chest x-ray, both of these things were completed on May 17, 2008.      Chest x-ray with no active cardiopulmonary disease.  3. CT of the head that shows no acute intracranial hemorrhage or      edema.   HISTORY OF PRESENT ILLNESS:  [name] is a 45 year old Caucasian  female with past medical history significant for hypertension, anxiety,  and hyperlipidemia who presents with palpitations and nervousness.  The  patient states, she has been treated at  the Urgent Care for her  hypertension and has had various reactions to different hypertensive  medications and different cholesterol medications.  Today she reports  her heart racing on and off throughout the day and increased anxiety.  On evaluation in the emergency room, she continues to feel anxious.  Telemetry in the ED shows sinus rhythm with PVCs occasionally a pattern  of bigeminy.  The patient denies any significant chest discomfort or  shortness of breath.  She does report some dizziness, but denies any  syncopal episodes.   HOSPITAL COURSE:  The patient was admitted and observed on telemetry.  Vital signs remained stable and the patient's symptoms  significantly  improved.  On the morning of May 17, 2008, the patient was deemed  stable for discharge.  The patient will have her Lopressor increased  from 50 mg daily to 50 mg twice a day and the rest of her medications  will remain the same.  The patient has been scheduled to see her primary  cardiologist, however, an appointment was not available until June.  The  patient will be instructed to call and set up an appointment with the  physician assistant Jacolyn Reedy, should she have return of her  symptoms or since they wish to have an earlier appointment.  The patient  was instructed to be vigilant by her symptomatology and return to Urgent  Care should she feel it necessary to be seen immediately.   PHYSICAL EXAMINATION:  VITAL SIGNS:  On day of discharge; temp 97.8  degrees Fahrenheit, BP 130/79, pulse 90, respiration rate 16, and O2  saturation 99% on room air.  Weight 67.9 kg.   On discharge, the patient received follow up instructions as well as a  new medication list and prescriptions in both  oral and written form, and  at the time of discharge, she had no questions or concerns that had not  been addressed.   DISCHARGE LABORATORIES:  This patient had 2 negative sets of point of  care markers and 2 full sets of cardiac enzymes, which were negative.  PT 12.7 and INR 0.9, a PTT 27, D-dimer less than 0.22.  Sodium 138,  potassium 3.8, chloride 105, BUN 12, creatinine 0.9, and glucose 126.  The patient had urinalysis that was within normal limits except for a  small amount of blood.   FOLLOWUP PLANS AND APPOINTMENTS:  The patient has a follow up  appointment with Dr. Daleen Squibb on July 26, 2008, at 3:30 p.m.   DISCHARGE MEDICATIONS:  1. Lopressor 50 mg p.o. b.i.d.  2. Hydrochlorothiazide 25 mg daily.  3. Zetia 10 mg daily.  4. Ativan 0.5 mg p.r.n. for anxiety.   DURATION OF DISCHARGE ENCOUNTER INCLUDING PHYSICIAN TIME:  Thirty-five  minutes.      Jarrett Ables,  PAC      Bruce R. Juanda Chance, MD, [facility]  Electronically Signed    MS/MEDQ  D:  05/17/2008  T:  05/18/2008  Job:  [id number]

## 2010-07-03 NOTE — H&P (Signed)
[name], [name] NO.:  [id number]   MEDICAL RECORD NO.:  [id number]          PATIENT TYPE:  EMS   LOCATION:  MAJO                         FACILITY:  [facility]   PHYSICIAN:  Brayton El, MD    DATE OF BIRTH:  07-Jun-1965   DATE OF ADMISSION:  05/16/2008  DATE OF DISCHARGE:                              HISTORY & PHYSICAL   CARDIOLOGIST:  Maisie Fus C. Wall, MD, [facility]   CHIEF COMPLAINT:  Palpitations.   HISTORY OF PRESENT ILLNESS:  [name] is a 45 year old white female  past medical history significant for hypertension, anxiety,  hyperlipidemia who is presenting with palpitations and nervousness  today.  The patient states that she has been treated at urgent care for  her hypertension and had various reaction to different antihypertensive  and different cholesterol medications as well.  Today she felt like her  heart was racing on and off throughout the day, and she felt her anxiety  level was high.  Here in the emergency room.  She continues to feel  nervousness. Telemetry in the ED showed sinus rhythm with PVCs  occasionally in a pattern of bigeminy.  The patient denies any  significant chest discomfort or shortness of breath.  She does endorse  some dizziness but denies any syncopal episodes.   PAST MEDICAL HISTORY:  As in HPI.   SOCIAL HISTORY:  The patient denies any tobacco or alcohol use.   FAMILY HISTORY:  Negative for premature coronary disease.   ALLERGIES:  NO KNOWN DRUG ALLERGIES.   MEDICATIONS:  1. Lopressor/HCT 50/25 mg daily.  2. Zetia 10 mg daily.  3. Lorazepam p.r.n.   REVIEW OF SYSTEMS:  As in HPI.  Other systems reviewed and are negative.   PHYSICAL EXAMINATION:  VITAL SIGNS:  The patient has a temperature of  98.5, pulse 89, respirations 20, blood pressure 151/74, satting 99% on  room air.  GENERAL:  No acute distress.  HEENT:  Nonfocal.  NECK:  Supple.  No carotid bruits.  No JVD.  HEART:  Regular rate and rhythm with  occasional ectopic beats.  No  murmur, rub or gallop.  LUNGS:  Clear to auscultation bilaterally.  ABDOMEN:  Soft, nontender, nondistended.  EXTREMITIES:  Without edema, pulse 2+ bilateral radial, dorsalis pedis  pulses.  SKIN:  Warm and dry.  NEUROLOGICAL:  Exam is nonfocal.  PSYCHIATRIC:  The patient appears anxious, but is appropriate.   LABORATORY DATA:  BMP 138, potassium 3.8, chloride 105, CO2 26, BUN 12,  creatinine 0.9, glucose 126.  White count 17, hemoglobin 15, hematocrit  43, platelet count 336.  LFTs within normal limits.  Troponin less than  0.05 x2 CK-MB less than 1.0 x2.  D-dimer was less than 0.22, INR 0.9.  UA normal limits other than small amount of blood.   Chest X-Ray: Within normal within normal limits.  CT of the head without  contrast was within normal limits.   ASSESSMENT:  1. Symptomatic PVCs.  2. Anxiety.  3. Leukocytosis.   PLAN:  The patient will be admitted to telemetry unit for observation.  We will  increase her Lopressor 50 mg daily and 50 mg b.i.d.  We will  place her on Ativan p.r.n. for anxiety.  We will check additional  cardiac markers, although myocardial ischemia is highly unlikely in this  patient.  The origin of her leukocytosis, unclear this time as there is  no obvious source for infection.  It may represent that it is  demargination from a stress response.      Brayton El, MD  Electronically Signed     Brayton El, MD  Electronically Signed    SGA/MEDQ  D:  05/17/2008  T:  05/17/2008  Job:  [id number]

## 2010-07-06 NOTE — Op Note (Signed)
Crystal, Cardenas                         ACCOUNT NO.:  [id number]   MEDICAL RECORD NO.:  [id number]                   PATIENT TYPE:  OBV   LOCATION:  9317                                 [facility]ACILITY[facility]   PHYSICIAN:  Juluis Mire, M.D.                DATE OF BIRTH:  [birth year]   DATE OF PROCEDURE:  05/06/2003  DATE OF DISCHARGE:  05/07/2003                                 OPERATIVE REPORT   PREOPERATIVE DIAGNOSES:  1. Menorrhagia and dysmenorrhea, possible uterine adenomyosis.  2. Stress urinary incontinence.   OPERATIVE PROCEDURE:  Laparoscopically-assisted vaginal hysterectomy.   SURGEON:  Juluis Mire, M.D.   ASSISTANT:  Guy Sandifer. Henderson Cloud, M.D.   ESTIMATED BLOOD LOSS:  200-300 mL.   PACKS AND DRAINS:  None.   INTRAOPERATIVE BLOOD REPLACED:  None.   COMPLICATIONS:  None.   INDICATIONS:  Dictated in the history and physical.   The procedure is as follows:  The patient was taken to the OR and placed in  the supine position with lateral tilt.  After satisfactory level of general  endotracheal anesthesia was obtained, the patient was placed in a dorsal  lithotomy position using the Allen stirrups.  The abdomen, perineum, and  vagina were prepped out with Betadine.  The bladder was emptied by in-and-  out catheterization.  A Hulka tenaculum was put in place.  The patient was  draped in a sterile field.  A subumbilical incision was made with the knife.  The Veress needle was introduced in the abdominal cavity.  The abdomen  inflated with approximately 3 L of carbon dioxide.  The operating  laparoscope was then introduced.  There was no evidence of injury to  adjacent organs.  A 5 mm trocar was put in place in the suprapubic area  under direct visualization.  Visualization revealed the uterus deviated  left.  There was a shortened left uterosacral ligament.  She had had  previous bilateral tubal ligation.  Ovaries were otherwise unremarkable.  The uterus was upper  limits of normal size, slightly irregular, consistent  with uterine fibroids.  Using the LigaSure bipolar system, we first went to  the right adnexa.  The right utero-ovarian pedicle was then cauterized and  incised.  The right tube and mesosalpinx were cauterized and incised, and  the right round ligament was cauterized and incised.  We extended that  cauterization incision into the broad ligament.  We then went to the left  side.  The left utero-ovarian pedicle was cauterized, incised.  The left  tube and mesosalpinx was cauterized, incised.  The left round ligament was  cauterized, incised.  We had good separation of the adnexa.  The abdomen was  deflated of its carbon dioxide, laparoscope was removed.   The patient's legs were repositioned, and the Hulka tenaculum was put in  place.  A weighted speculum was placed in the vaginal vault.  The  cervix was  grasped with a Christella Hartigan tenaculum.  The cul-de-sac was entered sharply.  Both  uterosacral ligaments were clamped, cut, and suture ligated with 0 Vicryl.  Reflection of the vaginal mucosa anteriorly was then incised and the bladder  was dissected superiorly.  Paracervical tissue was clamped, cut, and suture  ligated with 0 Vicryl.  The vesicouterine space was identified and entered  sharply.  A retractor was put in place to retract the bladder superiorly.  Using the clamp, cut, and tie technique with suture ligatures of 0 Vicryl,  the parametrium was serially separated from the sides of the uterus.  The  uterus was then flipped, held pedicles were clamped and cut, uterus was  passed off the operative field.  Held pedicles were secured with sutures of  0 Vicryl.  At this point in time we had good hemostasis.  The vaginal mucosa  was reapproximated in the midline with interrupted figure-of-eights of 0  Vicryl.  A Foley was placed to straight drain with retrieval of an adequate  amount of clear urine.  The sponge on a sponge stick was placed  in the  vaginal vault.   The abdomen was reinflated with carbon dioxide.  Visualization of the  vaginal cuff revealed some oozing, brought under control with the LigaSure  bipolar.  The ovarian pedicles were hemostatic and intact.  Urine output  remained clear and adequate.  We thoroughly irrigated the pelvis.  Irrigation was removed.  No active bleeding was noted.  The abdomen was  deflated of its carbon dioxide, all trocars removed.  The subumbilical  incision closed with interrupted subcuticulars of 4-0 Vicryl.  The  suprapubic incision was closed with Dermabond.  The sponge, instrument, and  needle count were reported as correct by the circulating nurse x2 at this  point.  Dr. Henderson Cloud came in at this point to complete the midurethral sling.                                               Juluis Mire, M.D.    JSM/MEDQ  D:  05/09/2003  T:  05/10/2003  Job:  [id number]

## 2010-07-06 NOTE — Discharge Summary (Signed)
[name], [name]                         ACCOUNT NO.:  [id number]   MEDICAL RECORD NO.:  [id number]                   PATIENT TYPE:  OBV   LOCATION:  9317                                 [facility]ACILITY[facility]   PHYSICIAN:  Juluis Mire, M.D.                DATE OF BIRTH:  04-Feb-1966   DATE OF ADMISSION:  05/06/2003  DATE OF DISCHARGE:  05/07/2003                                 DISCHARGE SUMMARY   ADMITTING DIAGNOSES:  1. Abnormal uterine bleeding and dysmenorrhea secondary to uterine     adenomyosis.  2. Stress urinary incontinence.   DISCHARGE DIAGNOSES:  1. Abnormal uterine bleeding and dysmenorrhea secondary to uterine     adenomyosis.  2. Stress urinary incontinence.   OPERATIVE PROCEDURE:  1. Laparoscopic-assisted vaginal hysterectomy.  2. Midurethral sling.   For complete history and physical please see dictated note.   COURSE IN THE HOSPITAL:  The patient underwent above-noted surgery.  Postoperative did relatively well.  Her initial postoperative hemoglobin had  dropped an unexpected amount from 14 to 8.2.  She was stable and a repeat  hemoglobin that day was 8.8.  At that time she was afebrile with stable  vital signs.  She was tolerating her diet.  Her abdominal  exam was  completely benign, bowel sounds were active, all incisions were clear.  She  had no active vaginal bleeding.  The Foley catheter had been removed.  We  instilled 200 mL of irrigant into the bladder.  The patient was able to void  over 100 mL.  The remainder of the day she voided without difficulty.  Therefore, we discharged her home on postoperative day #1.   COMPLICATIONS:  None encountered during stay in the hospital.  The patient  discharged home in stable condition.   DISPOSITION:  The patient to avoid heavy lifting, vaginal entry, or driving  a car.  She is to watch for signs of infection, nausea and vomiting,  increasing abdominal pain, or active vaginal bleeding.  Discharged home on  Tylox as needed for pain.   PLAN:  Reevaluate in the office early next week.                                               Juluis Mire, M.D.    JSM/MEDQ  D:  05/13/2003  T:  05/13/2003  Job:  [id number]

## 2010-07-06 NOTE — Discharge Summary (Signed)
[name], [name]                         ACCOUNT NO.:  [id number]   MEDICAL RECORD NO.:  [id number]                   PATIENT TYPE:  INP   LOCATION:  9319                                 [facility]   PHYSICIAN:  Juluis Mire, M.D.                DATE OF BIRTH:  [birth year]   DATE OF ADMISSION:  05/12/2003  DATE OF DISCHARGE:                                 DISCHARGE SUMMARY   ADMITTING DIAGNOSIS:  Abdominal wall hematoma with associated infection.   DISCHARGE DIAGNOSIS:  Abdominal wall hematoma with associated infection.   For complete history and physical please see dictated note.   COURSE IN THE HOSPITAL:  The patient was brought in and begun on IV Unasyn.  Her blood work revealed a white count of 14,600.  Hemoglobin was 10.2.  At  the time of discharge her hemoglobin was 8.8.  After the IV antibiotics were  started she remained afebrile throughout her remaining stay in the hospital.  The next morning she was afebrile, feeling much better.  Abdomen was soft.  Continued to have suprapubic fullness with associated tenderness consistent  with hematoma.  Actually, the tenderness was better.  She was tolerating her  diet.  We decided to send her home later this day as long as she remains  afebrile.   COMPLICATIONS:  None encountered during stay in the hospital.  The patient  discharged home in stable condition.   DISPOSITION:  Discharged home on Augmentin for the infection.  Continue to  use Tylox as needed for pain.  Continue postoperative management as  discussed.  Will follow up early next week.                                               Juluis Mire, M.D.    JSM/MEDQ  D:  05/13/2003  T:  05/13/2003  Job:  [id number]

## 2010-07-06 NOTE — H&P (Signed)
[name], [name]                         ACCOUNT NO.:  [id number]   MEDICAL RECORD NO.:  [id number]                   PATIENT TYPE:  INP   LOCATION:  9319                                 [facility]   PHYSICIAN:  Juluis Mire, M.D.                DATE OF BIRTH:  Jun 25, 1965   DATE OF ADMISSION:  05/12/2003  DATE OF DISCHARGE:                                HISTORY & PHYSICAL   CHIEF COMPLAINT:  The patient is a 45 year old gravida 3 para 3 married  white female admitted for an apparent postoperative infected hematoma.   PRESENT ADMISSION:  The patient on May 06, 2003 underwent a laparoscopic-  assisted vaginal hysterectomy and midurethral sling.  Postoperatively she  did well.  She did have an unexpected hemoglobin drop down to 8 but it was  stable.  She was discharged home on March 19.  Was seen back postoperatively  doing fairly well.  Last night she reported increasing fevers to 101.  Seen  for further evaluation, tolerating her diet.  No nausea or vomiting.  Has  had normal bowel and bladder function.   In examination in the office she had suprapubic tenderness.  Pelvic exam  revealed a probably retropubic or subrectus hematoma with associated  tenderness and probably the site of infection.   ALLERGIES:  No known drug allergies.   No medications except for Tylox as needed for pain.   PAST MEDICAL HISTORY:  Usual childhood diseases with no significant  sequelae.   SIGNIFICANT SURGICAL HISTORY:  The above-noted laparoscopic-assisted vaginal  hysterectomy with midurethral sling.   OBSTETRICAL HISTORY:  She had three spontaneous vaginal deliveries.   FAMILY HISTORY:  Noncontributory.   SOCIAL HISTORY:  No tobacco or alcohol use.   REVIEW OF SYSTEMS:  Noncontributory.   PHYSICAL EXAMINATION:  VITAL SIGNS:  The patient's temperature is 99; other  vital signs are stable.  LUNGS:  Clear.  CARDIOVASCULAR:  Regular rhythm and rate, no murmurs or gallops.  ABDOMEN:   Subumbilical and suprapubic incisions are intact.  Bowel sounds  are active.  She has marked suprapubic tenderness.  PELVIC:  Midurethral incision is intact.  Vaginal cuff intact, minimal  bleeding.  There is a marked fullness above the vagina with associated  tenderness.  It does not appear to be intraabdominal.  I think this is  probably under the rectus muscle.  EXTREMITIES:  Trace edema.  NEUROLOGIC:  Grossly within normal limits.   IMPRESSION:  Hematoma with associated infection postoperatively.   PLAN:  The patient will be admitted to begin on IV antibiotics and  management.                                               Juluis Mire, M.D.    JSM/MEDQ  D:  05/13/2003  T:  05/13/2003  Job:  [id number]

## 2010-07-06 NOTE — Discharge Summary (Signed)
[name], [name]                         ACCOUNT NO.:  [id number]   MEDICAL RECORD NO.:  [id number]                   PATIENT TYPE:  INP   LOCATION:  9318                                 [facility]ACILITY[facility]   PHYSICIAN:  Guy Sandifer. Arleta Creek, M.D.           DATE OF BIRTH:  04/17/1965   DATE OF ADMISSION:  05/23/2003  DATE OF DISCHARGE:                                 DISCHARGE SUMMARY   ADMITTING DIAGNOSIS:  Postoperative abdominal wall hematoma and low-grade  fever.   DISCHARGE DIAGNOSES:  Postoperative abdominal wall hematoma and low-grade  fever.   REASON FOR ADMISSION:  This patient is a 45 year old married white female  who underwent a laparoscopic-assisted vaginal hysterectomy and mid urethral  sling on May 06, 2003.  She developed an abdominal wall hematoma.  She was  hospitalized and placed on IV antibiotics and stabilized.  She was then sent  home on oral antibiotics which included Augmentin and then Cipro.  She  continued to have low-grade fever, not feeling well, and had a suspicion of  a mild enlargement of the abdominal wall hematoma.   HOSPITAL COURSE:  She was admitted to the hospital and underwent CT-guided  placement of a drain to the hematoma.  This produced about 180 mL initially  of old blood.  She was placed on Unasyn IV antibiotics.  On May 24, 2003  her white count is 11.1; hemoglobin 9.2; platelet count 536,000.  PT and PTT  were normal.  Repeat blood work on May 25, 2003 revealed a white count of  13.1 and a stable hemoglobin at 9.3.  She had a Tmax of 101 on May 25, 2003.  Since that time she has been afebrile for approximately 48 hours.  The output on the drain has steadily decreased and is now about 10-15 mL q.8-  12h.  She is feeling much better and has good pain relief.  She is  tolerating a regular diet, ambulating, having bowel movements.  The drain  was removed intact.   CONDITION ON DISCHARGE:  Good.   DIET:  Regular as tolerated..   ACTIVITY:  No lifting, no operation of automobiles, no vaginal entry.   FOLLOW-UP:  Follow-up is in the office next week as scheduled.   MEDICATIONS:  .  1. Augmentin 875 mg #14 one p.o. b.i.d.  2. Percocet one to two p.o. q.6h. p.r.n.  3. Multivitamin daily.  4. Iron tablet daily.   She is to call the office for problems including but not limited to:  Temperature of 101 degrees, increasing pain, persistent nausea/vomiting.                                               Guy Sandifer Arleta Creek, M.D.   JET/MEDQ  D:  05/27/2003  T:  05/27/2003  Job:  [id number]

## 2010-07-06 NOTE — H&P (Signed)
[name], [name]                         ACCOUNT NO.:  [id number]   MEDICAL RECORD NO.:  [id number]                   PATIENT TYPE:  INP   LOCATION:  9318                                 FACILITY:  [facility]   PHYSICIAN:  Juluis Mire, M.D.                DATE OF BIRTH:  October 04, 1965   DATE OF ADMISSION:  05/23/2003  DATE OF DISCHARGE:                                HISTORY & PHYSICAL   CHIEF COMPLAINT:  The patient is a 45 year old white female who underwent a  previously laparoscopic-assisted vaginal hysterectomy and midurethral sling  on May 06, 2003.  She has developed an abdominal wall hematoma.  Was  rehospitalized, placed on IV antibiotics, and stabilized.  Subsequently has  been home on oral antibiotics including Augmentin and Cipro.  Continues to  report low-grade fever at night.  Basically, just not feeling well.  On  reevaluation in the office her hemoglobin had dropped to 8.6.  An ultrasound  which was transabdominal revealed a 10 cm cyst with septation, probable  clot, and consistent with the noted hematoma.  In view of the patient's  fever and possible enlargement of the hematoma on exam we have brought her  back in for management.  We are going to place her on IV antibiotics,  consider CT-directed drainage.   ALLERGIES:  The patient has no known drug allergies.   MEDICATIONS:  She is on Tylox for pain, Motrin for fever, and is on  ciprofloxin.   PAST MEDICAL HISTORY:  Usual childhood diseases, no significant sequelae.   PAST SURGICAL HISTORY:  Includes the above-noted hysterectomy as listed.  She has had also corrective surgery for a cleft palate.   OBSTETRICAL HISTORY:  She has had two vaginal deliveries, one miscarriage.   FAMILY HISTORY:  Noncontributory.   SOCIAL HISTORY:  No tobacco or alcohol use.   REVIEW OF SYSTEMS:  Noncontributory.   PHYSICAL EXAMINATION:  VITAL SIGNS:  The patient's temperature is 98.7.  HEENT:  The patient is normocephalic.   Pupils equal, round, and reactive to  light and accommodation.  Extraocular movements were intact.  Sclerae and  conjunctivae were clear, oropharynx clear.  LUNGS:  Clear.  CARDIOVASCULAR:  Regular rhythm and rate without murmurs or gallops.  ABDOMEN:  Reveals a suprapubic fullness consistent with a known hematoma,  moderately tender.  Bowel sounds are otherwise active.  Incisions are all  intact, no drainage.  PELVIC:  She has a vaginal incision that is intact.  Cuff intact.  Bimanual  exam revealed the above-noted fullness, appears to be suprapubic and  consistent with the above-noted hematoma.  No active bleeding is noted.  EXTREMITIES:  Trace edema.  NEUROLOGIC:  Grossly within normal limits.   IMPRESSION:  Postoperative abdominal wall hematoma with associated low-grade  fever.   PLAN:  The patient will be rehospitalized.  We will place her on IV  antibiotics in the form of  Unasyn.  We are going to consider CT scan and  possible drainage of this hematoma with placement of a drain.  We will see  how she responds overnight to the antibiotics.                                               Juluis Mire, M.D.    JSM/MEDQ  D:  05/24/2003  T:  05/24/2003  Job:  [id number]

## 2010-07-06 NOTE — H&P (Signed)
Cardenas, Crystal                         ACCOUNT NO.:  [id number]   MEDICAL RECORD NO.:  [id number]                   PATIENT TYPE:  OBV   LOCATION:  9317                                 [facility]ACILITY:  [facility]   PHYSICIAN:  Juluis Mire, M.D.                DATE OF BIRTH:  Jan 19, 1966   DATE OF ADMISSION:  05/06/2003  DATE OF DISCHARGE:                                HISTORY & PHYSICAL   CHIEF COMPLAINT:  The patient is a 45 year old gravida 3 para 2 abortus 1  married white female who presents for laparoscopy-assisted vaginal  hysterectomy as well as suburethral sling.   PRESENT ADMISSION:  The patient has been having continued trouble with  menorrhagia and dysmenorrhea.  She has cycles that are approximately 5 days.  She has 3 days of heavy flow with associated clotting and cramping.  The  periods have been somewhat limiting from the patient's standpoint.  She has  had a saline infusion ultrasound in 2002 that was unremarkable.  She has  been on birth control pills and Ortho Evra with minimal response.  She was  also unable to tolerate these due to increasing headaches.  The cycles have  become extremely limiting.  We have discussed options including further  attempts at hormonal management.  She is somewhat concerned because of some  mildly elevated blood pressure.  I also have discussed endometrial ablation  versus hysterectomy and the patient is admitted for the latter procedure.   The patient also has problems with stress urinary incontinence.  She  underwent bladder studies in the office on April 07, 2003 with findings  consistent with anatomic stress urinary incontinence.  Dr. Thressa Sheller will  be doing a midurethral sling.   ALLERGIES:  No known drug allergies.   MEDICATIONS:  None.   PAST MEDICAL HISTORY:  Usual childhood diseases, no significant sequelae.   PAST SURGICAL HISTORY:  She has had a laparoscopic bilateral tubal ligation.  She has also had a repair of a  cleft palate.   OBSTETRICAL HISTORY:  Two spontaneous vaginal deliveries, one miscarriage.   FAMILY HISTORY:  Noncontributory.   SOCIAL HISTORY:  Reveals no tobacco or alcohol use.   REVIEW OF SYSTEMS:  Noncontributory.   PHYSICAL EXAMINATION:  VITAL SIGNS:  The patient is afebrile with stable  vital signs.  HEENT:  The patient is normocephalic.  Pupils equal, round, and reactive to  light and accommodation.  Extraocular movements were intact.  Sclerae and  conjunctivae were clear, oropharynx clear.  NECK:  Without thyromegaly.  BREASTS:  No discrete masses.  LUNGS:  Clear.  CARDIOVASCULAR:  Regular rhythm and rate without murmurs or gallops.  ABDOMEN:  Benign.  No mass, organomegaly, or tenderness.  PELVIC:  Normal external genitalia, vaginal mucosa is clear.  Cervix is  unremarkable.  Uterus upper limits of normal size.  Adnexa unremarkable.  EXTREMITIES:  Trace edema.  NEUROLOGIC:  Grossly within normal limits.   IMPRESSION:  1. Menorrhagia and dysmenorrhea with possible uterine adenomyosis     unresponsive to conservative management.  2. Stress urinary incontinence.   PLAN:  The patient will undergo the above-noted surgery.  Risks of surgery  have been discussed including the risk of infection; the risk of hemorrhage  that could necessitate transfusion with the risk of AIDS or hepatitis; the  risk of injury to adjacent organs including bladder, bowel, or ureters that  could require further exploratory surgery; the risk of deep venous  thrombosis and pulmonary embolus.   Midurethral sling success rates of 80-90% are quoted.  Discussed possibility  of injury to adjacent organs.  Discussed possible long-term catheterization  and inability to void that could require the taking down of the sling . The  patient professed understanding of indications and risks.                                               Juluis Mire, M.D.    JSM/MEDQ  D:  05/06/2003  T:  05/06/2003   Job:  [id number]

## 2010-07-06 NOTE — Op Note (Signed)
[name], [name]                         ACCOUNT NO.:  [id number]   MEDICAL RECORD NO.:  [id number]                   PATIENT TYPE:  OBV   LOCATION:  9399                                 FACILITY:  [facility]   PHYSICIAN:  Guy Sandifer. Arleta Creek, M.D.           DATE OF BIRTH:  11-27-1965   DATE OF PROCEDURE:  05/06/2003  DATE OF DISCHARGE:                                 OPERATIVE REPORT   PREOPERATIVE DIAGNOSIS:  Stress urinary incontinence.   POSTOPERATIVE DIAGNOSIS:  Stress urinary incontinence.   PROCEDURE:  Midurethral sling with Pelvilace.   SURGEON:  Guy Sandifer. Henderson Cloud, M.D.   ASSISTANT:  Juluis Mire, M.D.   ANESTHESIA:  General with endotracheal intubation.   ESTIMATED BLOOD LOSS:  100 mL.   INDICATIONS AND CONSENT:  This patient is a 45 year old married white female  with stress urinary incontinence demonstrated by symptomatology and  screening office urodynamics.  Options of management have been discussed  with the patient.  Midurethral vaginal sling with Pelvilace has been  discussed with the patient.  Potential risks and complications were  discussed preoperatively, including but not limited to infection, bowel,  bladder, or ureteral damage, bleeding requiring transfusion of blood  products with possible transfusion reaction, HIV and hepatitis acquisition,  DVT, PE, pneumonia, fistula formation, postoperative dyspareunia, recurrent  stress incontinence, prolonged catheterization or prolonged intermittent  self-catheterization.  All questions have been answered and consent is  signed on the chart.   PROCEDURE:  The patient is in the operating room, where she undergoes  laparoscopically-assisted vaginal hysterectomy with Dr. Arelia Sneddon.  That  procedure is completed.  Then with the patient still in the dorsal lithotomy  position, the weighted speculum is placed and the anterior vaginal mucosa is  grasped beginning 1 cm below the urethral meatus for a distance of  approximately 3 cm in the midline.  The mucosa is then infiltrated with 1%  Xylocaine with 1:200,000 epinephrine.  The suprapubic midline is marked with  a marking pen as well as points of incision one fingerbreadth lateral to the  midline immediately suprapubically bilaterally.  The points of incision are  also injected with Xylocaine with epinephrine.  The vaginal mucosa is then  opened in the midline suburethrally and dissected bilaterally sharply and  bluntly up to the level of the pubic symphysis without penetrating  urogenital diaphragm.  Incisions are then made suprapubically lateral to the  midline bilaterally.  A Foley catheter is then placed in the bladder and is  left indwelling.  Then using the Bard needle, the needles are passed  retropubically bilaterally, exiting beneath the pubic symphysis.  Cystoscopy  with a 70 degree scope is then performed.  Careful inspection in 360 degree  fashion reveals no evidence of penetration or trauma to the bladder.  The  cystoscope is then removed and the Foley catheter is replaced to drain the  bladder.  The Pelvilace is then attached  to the needles which are withdrawn,  thereby placing the sling in a midurethral fashion.  Proper tension is  verified by noting the sling can be readily seen, the sling is not tight  against the urethra, and a Kelly clamp placed beneath the sling can be  readily  rotated to a perpendicular fashion without any tension on the sling.  The  Pelvilace is then trimmed at the skin level suprapubically.  The vaginal  mucosa is closed with a running locking 2-0 Monocryl suture.  Dermabond is  applied suprapubically.  All counts correct.  The patient is awakened, taken  to the recovery room in stable condition.                                               Guy Sandifer Arleta Creek, M.D.    JET/MEDQ  D:  05/06/2003  T:  05/07/2003  Job:  [id number]

## 2010-10-11 ENCOUNTER — Ambulatory Visit (HOSPITAL_BASED_OUTPATIENT_CLINIC_OR_DEPARTMENT_OTHER)
Admission: RE | Admit: 2010-10-11 | Discharge: 2010-10-11 | Disposition: A | Discharge status: Home / Self Care | Payer: BC Managed Care – PPO | Source: Ambulatory Visit | Attending: Urology | Admitting: Urology

## 2010-10-11 DIAGNOSIS — Z01812 Encounter for preprocedural laboratory examination: Secondary | ICD-10-CM | POA: Insufficient documentation

## 2010-10-11 DIAGNOSIS — I10 Essential (primary) hypertension: Secondary | ICD-10-CM | POA: Insufficient documentation

## 2010-10-11 DIAGNOSIS — N393 Stress incontinence (female) (male): Secondary | ICD-10-CM | POA: Insufficient documentation

## 2010-10-11 LAB — PROTIME-INR
INR: 0.91 (ref 0.00–1.49)
Prothrombin Time: 12.5 s (ref 11.6–15.2)

## 2010-10-11 LAB — TYPE AND SCREEN
ABO/RH(D): O POS
Antibody Screen: NEGATIVE

## 2010-10-11 LAB — CBC
Hemoglobin: 13.2 g/dL (ref 12.0–15.0)
MCH: 31.9 pg (ref 26.0–34.0)
MCHC: 33.8 g/dL (ref 30.0–36.0)
MCV: 94.2 fL (ref 78.0–100.0)
RBC: 4.14 MIL/uL (ref 3.87–5.11)

## 2010-10-11 LAB — BASIC METABOLIC PANEL WITH GFR
BUN: 20 mg/dL (ref 6–23)
CO2: 25 meq/L (ref 19–32)
Calcium: 9.4 mg/dL (ref 8.4–10.5)
Chloride: 100 meq/L (ref 96–112)
Creatinine, Ser: 0.61 mg/dL (ref 0.50–1.10)
GFR calc Af Amer: >60 mL/min
GFR calc non Af Amer: >60 mL/min
Glucose, Bld: 115 mg/dL — ABNORMAL HIGH (ref 70–99)
Potassium: 3.8 meq/L (ref 3.5–5.1)
Sodium: 134 meq/L — ABNORMAL LOW (ref 135–145)

## 2010-10-11 LAB — ABO/RH: ABO/RH(D): O POS

## 2010-10-24 NOTE — Op Note (Signed)
  [name], [name]               ACCOUNT NO.:  [id number]  MEDICAL RECORD NO.:  [id number]  LOCATION:                                 FACILITY:  PHYSICIAN:  Martina Sinner, MD DATE OF BIRTH:  03-Jan-1966  DATE OF PROCEDURE:  10/11/2010 DATE OF DISCHARGE:                              OPERATIVE REPORT   PREOPERATIVE DIAGNOSIS:  Stress urinary incontinence.  POSTOPERATIVE DIAGNOSIS:  Stress urinary incontinence.  SURGERY:  Sling cystourethropexy [facility]) plus cystoscopy.  INDICATION FOR SURGERY:  [name] has stress incontinence.  She had a retropubic non-synthetic sling several years ago.  She consented to the above procedure.  The patient was placed in lithotomy.  Extra care was taken with leg positioning to minimize risk of compartment syndrome, neuropathy, and DVT.  Preoperative laboratory tests were normal. Antibiotics were given.  DESCRIPTION OF PROCEDURE:  Two less than 1 cm incisions were made 1 fingerbreadth above the symphysis pubis, 1.5 cm lateral to the midline. An appropriate depth 2 cm suburethral incision was made with my usual technique after instilling 3-4 cc of lidocaine/epinephrine mixture.  I sharply and bluntly dissected to the urethrovesical angle bilaterally.  With the patient in Trendelenburg, I passed the [facility] trocar on top of along the back of the symphysis pubis, staying lateral, using my box technique, turning it medially onto the __________ my index finger bilaterally.  I was extra careful with this maneuver because of her previous retropubic surgery.  The patient was then cystoscoped with 30-degree lens.  There was no foreign body in the bladder.  There was no movement of bladder with movement of trocar.  There was excellent blue jets bilaterally.  The urethra was normal.  With the bladder empty, I attached the [facility] sling and brought up through the retropubic space.  I tensioned this sling over a moderate- sized Kelly clamp.  I cut  below the blue dots, irrigated the sheaths, and removed the sheaths.  I spent a few moments making certain I was happy with the tension and position of the sling, and I was.  There was appropriate hypermobility, and no spring back effect.  She does have high-grade 2 cystocele, and the sling was not proximal as to cause a hinge effect.  All incisions were irrigated.  I closed the anterior vaginal wall with a running 2-0 Vicryl, followed by my interrupted sutures.  I cut the sling below the skin level suprapubically and closed each small incision with a 4-0 Vicryl followed by Dermabond.  Vaginal pack was inserted.  Leg position was good.  The patient was taken to the recovery room.  Urine was blue, clear at the end of the case.  [name] has mixed incontinence, and hopefully, this will reach her treatment goal.          ______________________________ Martina Sinner, MD     SAM/MEDQ  D:  10/11/2010  T:  10/11/2010  Job:  [id number]  Electronically Signed by Alfredo Martinez MD on 10/24/2010 08:14:24 AM

## 2010-10-27 ENCOUNTER — Other Ambulatory Visit: Payer: Self-pay | Admitting: Cardiology

## 2012-06-15 ENCOUNTER — Telehealth: Payer: Self-pay | Admitting: Family Medicine

## 2012-06-15 DIAGNOSIS — E785 Hyperlipidemia, unspecified: Secondary | ICD-10-CM

## 2012-06-16 NOTE — Telephone Encounter (Signed)
Pt aware and is coming in for labs in the morning.

## 2012-06-16 NOTE — Telephone Encounter (Signed)
Lab order put in

## 2012-06-17 ENCOUNTER — Other Ambulatory Visit (INDEPENDENT_AMBULATORY_CARE_PROVIDER_SITE_OTHER): Payer: BC Managed Care – PPO

## 2012-06-17 DIAGNOSIS — E785 Hyperlipidemia, unspecified: Secondary | ICD-10-CM

## 2012-06-17 DIAGNOSIS — Z0000 Encounter for general adult medical examination without abnormal findings: Secondary | ICD-10-CM

## 2012-06-17 LAB — HEPATIC FUNCTION PANEL
Albumin: 4.5 g/dL (ref 3.5–5.2)
Total Bilirubin: 0.7 mg/dL (ref 0.3–1.2)
Total Protein: 6.9 g/dL (ref 6.0–8.3)

## 2012-06-17 LAB — LIPID PANEL
HDL: 37 mg/dL — ABNORMAL LOW (ref >39)
Total CHOL/HDL Ratio: 4.2 Ratio
Triglycerides: 204 mg/dL — ABNORMAL HIGH (ref <150)

## 2012-06-17 LAB — BASIC METABOLIC PANEL
CO2: 27 mEq/L (ref 19–32)
Calcium: 9.3 mg/dL (ref 8.4–10.5)
Creat: 0.66 mg/dL (ref 0.50–1.10)
Glucose, Bld: 110 mg/dL — ABNORMAL HIGH (ref 70–99)

## 2012-06-19 NOTE — Progress Notes (Signed)
Pt made appt for next Wednesday to discuss labs.

## 2012-06-19 NOTE — Progress Notes (Signed)
LMTCB to make appt  

## 2012-06-24 ENCOUNTER — Encounter: Payer: Self-pay | Admitting: Family Medicine

## 2012-06-24 ENCOUNTER — Ambulatory Visit (INDEPENDENT_AMBULATORY_CARE_PROVIDER_SITE_OTHER): Payer: BC Managed Care – PPO | Admitting: Family Medicine

## 2012-06-24 VITALS — BP 138/74 | HR 62 | Temp 98.2°F | Resp 16 | Wt 169.0 lb

## 2012-06-24 DIAGNOSIS — E8881 Metabolic syndrome: Secondary | ICD-10-CM

## 2012-06-24 DIAGNOSIS — E785 Hyperlipidemia, unspecified: Secondary | ICD-10-CM

## 2012-06-24 DIAGNOSIS — I10 Essential (primary) hypertension: Secondary | ICD-10-CM | POA: Insufficient documentation

## 2012-06-24 MED ORDER — PRAVASTATIN SODIUM 20 MG PO TABS: 20.0000 mg | ORAL_TABLET | Freq: Every day | ORAL | Status: DC

## 2012-06-24 NOTE — Progress Notes (Signed)
  Subjective:    Patient ID: [name], female    DOB: 08-Nov-1965, 47 y.o.   MRN: [id number]  HPI Patient is here for followup of her hypertension and her mixed dyslipidemia and her metabolic syndrome. Her medication list is reviewed. She denies any chest pain, shortness of breath, or dyspnea on exertion. Her blood pressure is controlled at 138/74. I reviewed with the patient her CMP and fasting lipid panel. These were significant for a fasting blood sugar of 110, an HDL less than 40, an elevated triglyceride. Her LDL is well below 130.  She denies myalgia or right upper quadrant pain on her statin. Past Medical History  Diagnosis Date  . Metabolic syndrome   . Hyperlipidemia   . Hypertension    Current Outpatient Prescriptions on File Prior to Visit  Medication Sig Dispense Refill  . metoprolol (LOPRESSOR) 50 MG tablet TAKE 1 TABLET BY MOUTH TWO TIMES A DAY  60 tablet  10   No current facility-administered medications on file prior to visit.   No Known Allergies History   Social History  . Marital Status: Married    Spouse Name: N/A    Number of Children: N/A  . Years of Education: N/A   Occupational History  . Not on file.   Social History Main Topics  . Smoking status: Never Smoker   . Smokeless tobacco: Not on file  . Alcohol Use: No  . Drug Use: No  . Sexually Active: Not on file   Other Topics Concern  . Not on file   Social History Narrative  . No narrative on file      Review of Systems  All other systems reviewed and are negative.       Objective:   Physical Exam  Constitutional: She appears well-developed and well-nourished.  Cardiovascular: Normal rate, regular rhythm, normal heart sounds and intact distal pulses.   No murmur heard. Pulmonary/Chest: Effort normal and breath sounds normal. No respiratory distress. She has no wheezes. She has no rales.  Abdominal: Soft. Bowel sounds are normal. She exhibits no distension. There is no tenderness.  There is no rebound.          Assessment & Plan:  1. Metabolic syndrome Patient has a family history of diabetes in her father. We discussed implications of her hyperglycemia. I. recommended a low carbohydrate diet. Also recommended increasing aerobic exercise to a total of 30 minutes a day 5 days a week. Also recommended gradual weight loss. We'll recheck her fasting blood sugar in 6 months. We may need to get a hemoglobin A1c if it continues to rise  2. Hyperlipidemia Triglycerides are elevated. I recommended a low carbohydrate low-fat diet as I discussed in problem #1. Otherwise her LDL is at goal. We did discuss using aerobic exercise to increase her low HDL.  3. Hypertension Blood pressure is currently well controlled. Continue the lisinopril 20 mg by mouth daily along with Lopressor. The patient is having a slight cough. There discussed at length possibly switching to low starting to treat the cough. The patient is hesitant to do that at this time due to the fact medicines are working. She states she'll call if cough worsens.

## 2012-12-24 ENCOUNTER — Ambulatory Visit (INDEPENDENT_AMBULATORY_CARE_PROVIDER_SITE_OTHER): Payer: BC Managed Care – PPO | Admitting: Physician Assistant

## 2012-12-24 ENCOUNTER — Encounter: Payer: Self-pay | Admitting: Physician Assistant

## 2012-12-24 VITALS — BP 110/70 | HR 78 | Temp 98.3°F | Resp 18 | Wt 170.0 lb

## 2012-12-24 DIAGNOSIS — N390 Urinary tract infection, site not specified: Secondary | ICD-10-CM

## 2012-12-24 LAB — URINALYSIS, ROUTINE W REFLEX MICROSCOPIC
Bilirubin Urine: NEGATIVE
Glucose, UA: NEGATIVE mg/dL
Ketones, ur: NEGATIVE mg/dL
pH: 6 (ref 5.0–8.0)

## 2012-12-24 LAB — URINALYSIS, MICROSCOPIC ONLY
Bacteria, UA: NONE SEEN
Casts: NONE SEEN
WBC, UA: NONE SEEN WBC/hpf (ref <3)

## 2012-12-24 MED ORDER — NITROFURANTOIN MONOHYD MACRO 100 MG PO CAPS: 100.0000 mg | ORAL_CAPSULE | Freq: Two times a day (BID) | ORAL | Status: DC

## 2012-12-24 NOTE — Progress Notes (Signed)
Patient ID: [name] MRN: [id number], DOB: 1965-08-12, 47 y.o. Date of Encounter: 12/24/2012, 1:22 PM    Chief Complaint:  Chief Complaint  Patient presents with  . Urinary Tract Infection    X 1 week stinging and burning with some blood when she wipes     HPI: 47 y.o. year old white female ports the symptoms started 4 days ago. On that day she had some burning and pain with urination. Also is having urinary frequency.Then for a day or so seemed to be better. However then the symptoms returned. Again is having burning and pain with urination and also having urinary frequency. Yesterday at the end of urination she saw a small amount of blood. She has had no fever or chills and no unilateral back pain. He has had no vaginal irritation and no vaginal discharge.     Home Meds: See attached medication section for any medications that were entered at today's visit. The computer does not put those onto this list.The following list is a list of meds entered prior to today's visit.   Current Outpatient Prescriptions on File Prior to Visit  Medication Sig Dispense Refill  . citalopram (CELEXA) 20 MG tablet Take 20 mg by mouth daily.      Marland Kitchen lisinopril (PRINIVIL,ZESTRIL) 20 MG tablet Take 20 mg by mouth daily.      . metoprolol (LOPRESSOR) 50 MG tablet TAKE 1 TABLET BY MOUTH TWO TIMES A DAY  60 tablet  10  . pravastatin (PRAVACHOL) 20 MG tablet Take 1 tablet (20 mg total) by mouth daily.  30 tablet  11   No current facility-administered medications on file prior to visit.    Allergies: No Known Allergies    Review of Systems: See HPI for pertinent ROS. All other ROS negative.    Physical Exam: Blood pressure 110/70, pulse 78, temperature 98.3 F (36.8 C), temperature source Oral, resp. rate 18, weight 170 lb (77.111 kg)., Body mass index is 30.12 kg/(m^2). General:  WF. Appears in no acute distress. Lungs: Clear bilaterally to auscultation without wheezes, rales, or rhonchi.  Breathing is unlabored. Heart: Regular rhythm. No murmurs, rubs, or gallops. Abdomen: Soft, non-tender, non-distended with normoactive bowel sounds. No hepatomegaly. No rebound/guarding. No obvious abdominal masses. Very mild tenderness with palpation of the suprapubic area. No other areas of tenderness. Msk:  Strength and tone normal for age. No tenderness with percussion of the costophrenic angles bilaterally. Extremities/Skin: Warm and dry. No clubbing or cyanosis. No edema. No rashes or suspicious lesions. Neuro: Alert and oriented X 3. Moves all extremities spontaneously. Gait is normal. CNII-XII grossly in tact. Psych:  Responds to questions appropriately with a normal affect.   Results for orders placed in visit on 12/24/12  URINALYSIS, ROUTINE W REFLEX MICROSCOPIC      Result Value Range   Color, Urine STRAW  YELLOW   APPearance CLEAR  CLEAR   Specific Gravity, Urine <1.005 (*) 1.005 - 1.030   pH 6.0  5.0 - 8.0   Glucose, UA NEG  NEG mg/dL   Bilirubin Urine NEG  NEG   Ketones, ur NEG  NEG mg/dL   Hgb urine dipstick TRACE (*) NEG   Protein, ur NEG  NEG mg/dL   Urobilinogen, UA 0.2  0.0 - 1.0 mg/dL   Nitrite NEG  NEG   Leukocytes, UA NEG  NEG  URINALYSIS, MICROSCOPIC ONLY      Result Value Range   Squamous Epithelial / LPF FEW  RARE  Crystals NONE SEEN  NONE SEEN   Casts NONE SEEN  NONE SEEN   WBC, UA NONE SEEN  <3 WBC/hpf   RBC / HPF 0-2  <3 RBC/hpf   Bacteria, UA NONE SEEN  RARE     ASSESSMENT AND PLAN:  47 y.o. year old female with  1. UTI (urinary tract infection) - Urinalysis, Routine w reflex microscopic This patient the urinalysis really looks normal and is not really show signs of infection. However her symptoms are consistent with UTI. Symptoms really are not consistent with any other type of problem. She is leaving tomorrow at noon to go out of 10. Told her to go ahead and pick up the antibiotic in case she needs it. However would hold off on starting the  medicine. If her symptoms persist then would take the antibiotic. If she does start the antibiotic that she is to complete all of it and take as directed. Followup if symptoms persist and do not resolve. - nitrofurantoin, macrocrystal-monohydrate, (MACROBID) 100 MG capsule; Take 1 capsule (100 mg total) by mouth 2 (two) times daily.  Dispense: 6 capsule; Refill: 0   Signed, [address] [place], [place], [facility] 12/24/2012 1:22 PM

## 2013-02-16 ENCOUNTER — Telehealth: Payer: Self-pay | Admitting: Family Medicine

## 2013-02-16 MED ORDER — LISINOPRIL 20 MG PO TABS: 20.0000 mg | ORAL_TABLET | Freq: Every day | ORAL | Status: DC

## 2013-02-16 NOTE — Telephone Encounter (Signed)
Medication refilled per protocol.Patient needs to be seen before any further refills 

## 2013-02-19 ENCOUNTER — Other Ambulatory Visit: Payer: Self-pay | Admitting: Family Medicine

## 2013-02-19 NOTE — Telephone Encounter (Signed)
Pt NTBS 

## 2013-02-22 ENCOUNTER — Telehealth: Payer: Self-pay | Admitting: *Deleted

## 2013-02-22 MED ORDER — METOPROLOL TARTRATE 50 MG PO TABS: ORAL_TABLET | ORAL | Status: DC

## 2013-02-22 NOTE — Telephone Encounter (Signed)
Med refilled, pt has appt on Thursday Jan 8

## 2013-02-25 ENCOUNTER — Ambulatory Visit (INDEPENDENT_AMBULATORY_CARE_PROVIDER_SITE_OTHER): Payer: BC Managed Care – PPO | Admitting: Physician Assistant

## 2013-02-25 ENCOUNTER — Encounter: Payer: Self-pay | Admitting: Physician Assistant

## 2013-02-25 VITALS — BP 150/96 | HR 64 | Temp 98.3°F | Resp 18 | Wt 167.0 lb

## 2013-02-25 DIAGNOSIS — R739 Hyperglycemia, unspecified: Secondary | ICD-10-CM | POA: Insufficient documentation

## 2013-02-25 DIAGNOSIS — F411 Generalized anxiety disorder: Secondary | ICD-10-CM

## 2013-02-25 DIAGNOSIS — E785 Hyperlipidemia, unspecified: Secondary | ICD-10-CM

## 2013-02-25 DIAGNOSIS — R7309 Other abnormal glucose: Secondary | ICD-10-CM

## 2013-02-25 DIAGNOSIS — I10 Essential (primary) hypertension: Secondary | ICD-10-CM

## 2013-02-25 LAB — COMPLETE METABOLIC PANEL WITH GFR
ALBUMIN: 4.5 g/dL (ref 3.5–5.2)
ALT: 23 U/L (ref 0–35)
AST: 16 U/L (ref 0–37)
Alkaline Phosphatase: 63 U/L (ref 39–117)
BUN: 11 mg/dL (ref 6–23)
CALCIUM: 9.5 mg/dL (ref 8.4–10.5)
CHLORIDE: 105 meq/L (ref 96–112)
CO2: 29 meq/L (ref 19–32)
CREATININE: 0.72 mg/dL (ref 0.50–1.10)
GFR, Est Non African American: >89 mL/min
Glucose, Bld: 110 mg/dL — ABNORMAL HIGH (ref 70–99)
POTASSIUM: 5.2 meq/L (ref 3.5–5.3)
Sodium: 140 mEq/L (ref 135–145)
Total Bilirubin: 0.9 mg/dL (ref 0.3–1.2)
Total Protein: 6.8 g/dL (ref 6.0–8.3)

## 2013-02-25 LAB — LIPID PANEL
CHOLESTEROL: 168 mg/dL (ref 0–200)
HDL: 35 mg/dL — ABNORMAL LOW (ref >39)
LDL CALC: 90 mg/dL (ref 0–99)
TRIGLYCERIDES: 213 mg/dL — AB (ref <150)
Total CHOL/HDL Ratio: 4.8 Ratio
VLDL: 43 mg/dL — AB (ref 0–40)

## 2013-02-25 LAB — HEMOGLOBIN A1C
Hgb A1c MFr Bld: 6.2 % — ABNORMAL HIGH (ref <5.7)
MEAN PLASMA GLUCOSE: 131 mg/dL — AB (ref <117)

## 2013-02-25 MED ORDER — PRAVASTATIN SODIUM 20 MG PO TABS: 20.0000 mg | ORAL_TABLET | Freq: Every day | ORAL | Status: DC

## 2013-02-25 MED ORDER — CITALOPRAM HYDROBROMIDE 20 MG PO TABS: 20.0000 mg | ORAL_TABLET | Freq: Every day | ORAL | Status: DC

## 2013-02-25 MED ORDER — LISINOPRIL 20 MG PO TABS: 20.0000 mg | ORAL_TABLET | Freq: Every day | ORAL | Status: DC

## 2013-02-25 MED ORDER — METOPROLOL TARTRATE 50 MG PO TABS: ORAL_TABLET | ORAL | Status: DC

## 2013-02-25 NOTE — Progress Notes (Signed)
Patient ID: [name] MRN: [id number], DOB: [birth year], 48 y.o. Date of Encounter: @DATE @  Chief Complaint:  Chief Complaint  Patient presents with  . Medication Refill    is fasting    HPI: 48 y.o. year old white female  presents for routine followup office visit.  Her last regular office visit to followup with these issues was with Dr. Tanya Nones on 06/24/12.  At that time he discussed low carbohydrate diet and adding some cardiovascular exercise secondary to some mild hyperglycemia and some hypertriglyceridemia on labs. She says that she did make some diet changes. However she really has not added much exercise. Says that she did add some walking for some time but never was walking a lot. Not walking at all now that the weather is colder.  She is taking both blood pressure medications as directed. No adverse effects. No lightheadedness. She notes that we got all high blood pressure reading today. She says that she checks blood pressure routinely at home and gets it actually fairly low around 115/65. She also notes that she was seen here for UTI in November and it was low. I did check our records and at that date was 110/70.  She is taking pravastatin. No myalgias or right upper quadrant pain or other adverse effects.  She is taking citalopram secondary to history of anxiety. She says that this is working very well controlled her anxiety very well. Her mood is stable with medication. She is having no adverse effects. Definitely feels that she needs to continue the medication.  She has no complaints today. Very pleasant female.   Past Medical History  Diagnosis Date  . Metabolic syndrome   . Hyperlipidemia   . Hypertension      Home Meds: See attached medication section for current medication list. Any medications entered into computer today will not appear on this note's list. The medications listed below were entered prior to today. No current outpatient prescriptions on  file prior to visit.   No current facility-administered medications on file prior to visit.    Allergies:  Allergies  Allergen Reactions  . Niacin And Related Other (See Comments)    Passes out    History   Social History  . Marital Status: Married    Spouse Name: N/A    Number of Children: N/A  . Years of Education: N/A   Occupational History  . Not on file.   Social History Main Topics  . Smoking status: Never Smoker   . Smokeless tobacco: Not on file  . Alcohol Use: No  . Drug Use: No  . Sexual Activity: Not on file   Other Topics Concern  . Not on file   Social History Narrative  . No narrative on file    Family History  Problem Relation Age of Onset  . Diabetes Father   . Vasculitis Father      Review of Systems:  See HPI for pertinent ROS. All other ROS negative.    Physical Exam: Blood pressure 150/96, pulse 64, temperature 98.3 F (36.8 C), temperature source Oral, resp. rate 18, weight 167 lb (75.751 kg)., Body mass index is 29.59 kg/(m^2). General: Mildly Overweight white female. Appears in no acute distress. Neck: Supple. No thyromegaly. No lymphadenopathy. No carotid bruits. Lungs: Clear bilaterally to auscultation without wheezes, rales, or rhonchi. Breathing is unlabored. Heart: RRR with S1 S2. No murmurs, rubs, or gallops. Abdomen: Soft, non-tender, non-distended with normoactive bowel sounds. No hepatomegaly. No rebound/guarding.  No obvious abdominal masses. Musculoskeletal:  Strength and tone normal for age. Extremities/Skin: Warm and dry. No clubbing or cyanosis. No edema. No rashes or suspicious lesions. Neuro: Alert and oriented X 3. Moves all extremities spontaneously. Gait is normal. CNII-XII grossly in tact. Psych:  Responds to questions appropriately with a normal affect. Mood and affect are very appropriate. She is very pleasant.      ASSESSMENT AND PLAN:  48 y.o. year old female with  1. ANXIETY Controlled with current dose of  Celexa. - COMPLETE METABOLIC PANEL WITH GFR - citalopram (CELEXA) 20 MG tablet; Take 1 tablet (20 mg total) by mouth daily.  Dispense: 30 tablet; Refill: 5  2. HYPERLIPIDEMIA-MIXED He has decreased carbohydrates to try to decrease triglycerides. She is not doing any cardiovascular exercise to increase HDL. As well she is on pravastatin. - COMPLETE METABOLIC PANEL WITH GFR - Lipid panel - pravastatin (PRAVACHOL) 20 MG tablet; Take 1 tablet (20 mg total) by mouth daily.  Dispense: 30 tablet; Refill: 5  3. Hypertension I think the blood pressure reading today is erroneous. Blood pressure was excellent at her visit here on 06/24/12 as well as 12/24/12. As well she is getting excellent blood pressure readings at home. Continue current blood pressure medications and check labs to monitor - COMPLETE METABOLIC PANEL WITH GFR - metoprolol (LOPRESSOR) 50 MG tablet; TAKE 1 TABLET BY MOUTH TWO TIMES A DAY  Dispense: 60 tablet; Refill: 11 She is also on lisinopril 20 mg which was also sent for refill. 4. Hyperglycemia She's had mild hyperglycemia in the past. Has made diet changes with low carbohydrates. Her father has now passed away but he had a history of diabetes. We'll monitor this with glucose and A1c. - COMPLETE METABOLIC PANEL WITH GFR - Hemoglobin A1c  Routine office visit 6 months or sooner if needed.  Signed, [address]Mary Beth GuilfordDixon, GeorgiaPA, [facility] 02/25/2013 9:08 AM

## 2013-02-26 ENCOUNTER — Telehealth: Payer: Self-pay | Admitting: Family Medicine

## 2013-02-26 NOTE — Telephone Encounter (Signed)
Message copied by Donne AnonPLUMMER, Nycole Kawahara M on Fri Feb 26, 2013  3:48 PM ------      Message from: Allayne ButcherIXON, MARY      Created: Fri Feb 26, 2013  1:37 PM       Tell her to continue current cholesterol medication for cholesterol.      Tell her that her glucose and A1c continue to be borderline. At the office visit yesterday she said that she was somewhat compliant with the diet changes. Tell her to be more compliant with this or we will have to add medications in the future.      Rest of labs normal.       ------

## 2013-02-26 NOTE — Telephone Encounter (Signed)
Pt aware of lab results and provider recommendations 

## 2013-03-02 ENCOUNTER — Encounter (HOSPITAL_COMMUNITY): Payer: Self-pay | Admitting: Emergency Medicine

## 2013-03-02 ENCOUNTER — Observation Stay (HOSPITAL_COMMUNITY)
Admission: EM | Admit: 2013-03-02 | Discharge: 2013-03-03 | Disposition: A | Discharge status: Home / Self Care | Payer: BC Managed Care – PPO | Attending: Internal Medicine | Admitting: Internal Medicine

## 2013-03-02 ENCOUNTER — Emergency Department (HOSPITAL_COMMUNITY): Payer: BC Managed Care – PPO

## 2013-03-02 DIAGNOSIS — F411 Generalized anxiety disorder: Secondary | ICD-10-CM

## 2013-03-02 DIAGNOSIS — R110 Nausea: Secondary | ICD-10-CM | POA: Insufficient documentation

## 2013-03-02 DIAGNOSIS — R002 Palpitations: Secondary | ICD-10-CM | POA: Diagnosis present

## 2013-03-02 DIAGNOSIS — R079 Chest pain, unspecified: Secondary | ICD-10-CM

## 2013-03-02 DIAGNOSIS — E8881 Metabolic syndrome: Secondary | ICD-10-CM

## 2013-03-02 DIAGNOSIS — K3189 Other diseases of stomach and duodenum: Secondary | ICD-10-CM

## 2013-03-02 DIAGNOSIS — R739 Hyperglycemia, unspecified: Secondary | ICD-10-CM

## 2013-03-02 DIAGNOSIS — R072 Precordial pain: Principal | ICD-10-CM | POA: Insufficient documentation

## 2013-03-02 DIAGNOSIS — R1013 Epigastric pain: Secondary | ICD-10-CM

## 2013-03-02 DIAGNOSIS — Z79899 Other long term (current) drug therapy: Secondary | ICD-10-CM | POA: Insufficient documentation

## 2013-03-02 DIAGNOSIS — E785 Hyperlipidemia, unspecified: Secondary | ICD-10-CM

## 2013-03-02 DIAGNOSIS — R42 Dizziness and giddiness: Secondary | ICD-10-CM | POA: Insufficient documentation

## 2013-03-02 DIAGNOSIS — R0602 Shortness of breath: Secondary | ICD-10-CM | POA: Insufficient documentation

## 2013-03-02 DIAGNOSIS — R7309 Other abnormal glucose: Secondary | ICD-10-CM

## 2013-03-02 DIAGNOSIS — I10 Essential (primary) hypertension: Secondary | ICD-10-CM

## 2013-03-02 LAB — POCT I-STAT TROPONIN I: TROPONIN I, POC: 0 ng/mL (ref 0.00–0.08)

## 2013-03-02 LAB — BASIC METABOLIC PANEL
BUN: 21 mg/dL (ref 6–23)
CHLORIDE: 101 meq/L (ref 96–112)
CO2: 23 meq/L (ref 19–32)
Calcium: 9.4 mg/dL (ref 8.4–10.5)
Creatinine, Ser: 0.78 mg/dL (ref 0.50–1.10)
GFR calc Af Amer: >90 mL/min (ref >90)
GFR calc non Af Amer: >90 mL/min (ref >90)
GLUCOSE: 134 mg/dL — AB (ref 70–99)
POTASSIUM: 4.1 meq/L (ref 3.7–5.3)
SODIUM: 139 meq/L (ref 137–147)

## 2013-03-02 LAB — TROPONIN I

## 2013-03-02 LAB — HEPATIC FUNCTION PANEL
ALK PHOS: 67 U/L (ref 39–117)
ALT: 22 U/L (ref 0–35)
AST: 18 U/L (ref 0–37)
Albumin: 4.3 g/dL (ref 3.5–5.2)
BILIRUBIN TOTAL: 1.1 mg/dL (ref 0.3–1.2)
Total Protein: 7.6 g/dL (ref 6.0–8.3)

## 2013-03-02 LAB — CBC WITH DIFFERENTIAL/PLATELET
BASOS ABS: 0 10*3/uL (ref 0.0–0.1)
Basophils Relative: 0 % (ref 0–1)
EOS PCT: 1 % (ref 0–5)
Eosinophils Absolute: 0.1 10*3/uL (ref 0.0–0.7)
HEMATOCRIT: 41.9 % (ref 36.0–46.0)
Hemoglobin: 14.6 g/dL (ref 12.0–15.0)
LYMPHS ABS: 2.1 10*3/uL (ref 0.7–4.0)
LYMPHS PCT: 28 % (ref 12–46)
MCH: 32.8 pg (ref 26.0–34.0)
MCHC: 34.8 g/dL (ref 30.0–36.0)
MCV: 94.2 fL (ref 78.0–100.0)
Monocytes Absolute: 0.6 10*3/uL (ref 0.1–1.0)
Monocytes Relative: 8 % (ref 3–12)
NEUTROS ABS: 4.7 10*3/uL (ref 1.7–7.7)
Neutrophils Relative %: 63 % (ref 43–77)
PLATELETS: 292 10*3/uL (ref 150–400)
RBC: 4.45 MIL/uL (ref 3.87–5.11)
RDW: 12.7 % (ref 11.5–15.5)
WBC: 7.5 10*3/uL (ref 4.0–10.5)

## 2013-03-02 LAB — CBC
HCT: 42 % (ref 36.0–46.0)
Hemoglobin: 14.5 g/dL (ref 12.0–15.0)
MCH: 32.5 pg (ref 26.0–34.0)
MCHC: 34.5 g/dL (ref 30.0–36.0)
MCV: 94.2 fL (ref 78.0–100.0)
PLATELETS: 329 10*3/uL (ref 150–400)
RBC: 4.46 MIL/uL (ref 3.87–5.11)
RDW: 12.7 % (ref 11.5–15.5)
WBC: 9.7 10*3/uL (ref 4.0–10.5)

## 2013-03-02 LAB — CREATININE, SERUM
CREATININE: 0.72 mg/dL (ref 0.50–1.10)
GFR calc non Af Amer: >90 mL/min (ref >90)

## 2013-03-02 LAB — TSH: TSH: 1.318 u[IU]/mL (ref 0.350–4.500)

## 2013-03-02 MED ORDER — ZOLPIDEM TARTRATE 5 MG PO TABS: 5.0000 mg | ORAL_TABLET | Freq: Every evening | ORAL | Status: DC | PRN

## 2013-03-02 MED ORDER — SIMVASTATIN 20 MG PO TABS
20.0000 mg | ORAL_TABLET | Freq: Every day | ORAL | Status: DC
  Administered 2013-03-02: 20 mg via ORAL
  Filled 2013-03-02 (×2): qty 1

## 2013-03-02 MED ORDER — ASPIRIN EC 325 MG PO TBEC
325.0000 mg | DELAYED_RELEASE_TABLET | Freq: Every day | ORAL | Status: DC
  Administered 2013-03-03: 325 mg via ORAL
  Filled 2013-03-02: qty 1

## 2013-03-02 MED ORDER — CITALOPRAM HYDROBROMIDE 20 MG PO TABS
20.0000 mg | ORAL_TABLET | Freq: Every day | ORAL | Status: DC
  Administered 2013-03-02: 20 mg via ORAL
  Filled 2013-03-02 (×2): qty 1

## 2013-03-02 MED ORDER — NITROGLYCERIN 2 % TD OINT
1.0000 [in_us] | TOPICAL_OINTMENT | Freq: Once | TRANSDERMAL | Status: AC
  Administered 2013-03-02: 1 [in_us] via TOPICAL
  Filled 2013-03-02: qty 1

## 2013-03-02 MED ORDER — ACETAMINOPHEN 325 MG PO TABS
650.0000 mg | ORAL_TABLET | Freq: Four times a day (QID) | ORAL | Status: DC | PRN
  Administered 2013-03-02: 650 mg via ORAL
  Filled 2013-03-02: qty 2

## 2013-03-02 MED ORDER — CITALOPRAM HYDROBROMIDE 20 MG PO TABS
20.0000 mg | ORAL_TABLET | Freq: Every day | ORAL | Status: DC
  Filled 2013-03-02: qty 1

## 2013-03-02 MED ORDER — LISINOPRIL 20 MG PO TABS
20.0000 mg | ORAL_TABLET | Freq: Every day | ORAL | Status: DC
  Administered 2013-03-03: 20 mg via ORAL
  Filled 2013-03-02: qty 1

## 2013-03-02 MED ORDER — HEPARIN SODIUM (PORCINE) 5000 UNIT/ML IJ SOLN
5000.0000 [IU] | Freq: Three times a day (TID) | INTRAMUSCULAR | Status: DC
  Administered 2013-03-02 – 2013-03-03 (×2): 5000 [IU] via SUBCUTANEOUS
  Filled 2013-03-02 (×7): qty 1

## 2013-03-02 MED ORDER — NITROGLYCERIN 0.4 MG SL SUBL
0.4000 mg | SUBLINGUAL_TABLET | SUBLINGUAL | Status: DC | PRN
  Administered 2013-03-02: 0.4 mg via SUBLINGUAL

## 2013-03-02 MED ORDER — METOPROLOL TARTRATE 50 MG PO TABS
50.0000 mg | ORAL_TABLET | Freq: Two times a day (BID) | ORAL | Status: DC
  Administered 2013-03-02 – 2013-03-03 (×2): 50 mg via ORAL
  Filled 2013-03-02 (×4): qty 1

## 2013-03-02 MED ORDER — ASPIRIN 325 MG PO TABS
325.0000 mg | ORAL_TABLET | Freq: Once | ORAL | Status: AC
  Administered 2013-03-02: 325 mg via ORAL
  Filled 2013-03-02: qty 1

## 2013-03-02 MED ORDER — MORPHINE SULFATE 2 MG/ML IJ SOLN
1.0000 mg | INTRAMUSCULAR | Status: DC | PRN
  Administered 2013-03-02: 1 mg via INTRAVENOUS
  Filled 2013-03-02: qty 1

## 2013-03-02 MED ORDER — ONDANSETRON HCL 4 MG/2ML IJ SOLN
4.0000 mg | Freq: Once | INTRAMUSCULAR | Status: AC
  Administered 2013-03-02: 4 mg via INTRAVENOUS
  Filled 2013-03-02: qty 2

## 2013-03-02 NOTE — ED Notes (Signed)
Pt presents to department for evaluation of midsternal chest heaviness, SOB and nausea. Onset this morning. 3/10 pain upon arrival, increases with movement. Respirations unlabored. Skin warm and dry. Pt is alert and oriented x4. No signs of distress noted.

## 2013-03-02 NOTE — H&P (Signed)
[facility] History and Physical  [name] NWG:[id number] DOB: 1965-11-23 DOA: 03/02/2013  Referring physician: Dr. Gwendolyn Grant PCP: Leo Grosser, MD   Chief Complaint: chest pain HPI: [name] is a 48 y.o. female with a history of hypertension, hyperlipidemia and mild hyperglycemia who presents with chest pain. She reports that for the past two weeks she has been trying to exercise more, she is very short of breath and develops chest tightness with exertion.   She has also had chest tightness and palpitations with rest over the past few weeks. This morning while getting up to take a shower she felt chest tightness and a heaviness in the central chest that radiated to her back and left shoulder and arm.  She felt slightly short of breath and nauseated at the time. She did not feel lightheaded. She did not vomit.  Pain has persisted for about 5 hours and is somewhat relieved in the ED with nitroglycerin.  Review of Systems:  Constitutional: No weight loss, night sweats, Fevers, chills, fatigue.  HEENT: No headaches, Difficulty swallowing,Tooth/dental problems,Sore throat, No sneezing, itching, ear ache, nasal congestion, post nasal drip,  Cardio-vascular: does have chest pain, no Orthopnea, PND, swelling in lower extrmities, anasarca, dizziness, has had palpitations  GI: No heartburn, indigestion, abdominal pain, nausea, vomiting, diarrhea, change in bowel habits, loss of appetite  Resp: No shortness of breath with exertion or at rest. No excess mucus, no productive cough, No non-productive cough, No coughing up of blood.No change in color of mucus.No wheezing.No chest wall deformity  Skin: no rash or lesions.  GU: no dysuria, change in color of urine, no urgency or frequency. No flank pain.  Musculoskeletal: No joint pain or swelling. No decreased range of motion. No back pain.  Psych: No change in mood or affect. No depression or anxiety. No memory loss.   Past Medical  History  Diagnosis Date  . Metabolic syndrome   . Hyperlipidemia   . Hypertension    History reviewed. No pertinent past surgical history. Social History:  reports that she has never smoked. She does not have any smokeless tobacco history on file. She reports that she does not drink alcohol or use illicit drugs.  Allergies  Allergen Reactions  . Niacin And Related Other (See Comments)    Passes out    Family History  Problem Relation Age of Onset  . Diabetes Father   . Vasculitis Father      Prior to Admission medications   Medication Sig Start Date End Date Taking? Authorizing Provider  citalopram (CELEXA) 20 MG tablet Take 20 mg by mouth at bedtime. 02/25/13  Yes Mary B Dixon, PA-C  lisinopril (PRINIVIL,ZESTRIL) 20 MG tablet Take 1 tablet (20 mg total) by mouth daily. 02/25/13  Yes Mary B Dixon, PA-C  metoprolol (LOPRESSOR) 50 MG tablet Take 50 mg by mouth 2 (two) times daily.   Yes Historical Provider, MD  pravastatin (PRAVACHOL) 20 MG tablet Take 20 mg by mouth at bedtime.   Yes Historical Provider, MD   Physical Exam: Filed Vitals:   03/02/13 0915  BP: 133/85  Pulse: 63  Temp:   Resp: 12    BP 133/85  Pulse 63  Temp(Src) 98.1 F (36.7 C) (Oral)  Resp 12  Ht 5\' 4"  (1.626 m)  Wt 74.844 kg (165 lb)  BMI 28.31 kg/m2  SpO2 97%  General:  Appears calm, slightly uncomfortable Eyes: PERRL, normal lids, irises & conjunctiva ENT: grossly normal hearing, lips & tongue Neck:  no LAD, masses or thyromegaly Cardiovascular: RRR, no m/r/g. No LE edema. Telemetry: SR, no arrhythmias  Respiratory: CTA bilaterally, no w/r/r. Normal respiratory effort. Abdomen: soft, ntnd, no HSM, no gaurding Skin: no rash or induration seen on limited exam Musculoskeletal: grossly normal tone BUE/BLE Psychiatric: grossly normal mood and affect, speech fluent and appropriate Neurologic: grossly non-focal.          Labs on Admission:  Basic Metabolic Panel:  Recent Labs Lab 02/25/13 0906  03/02/13 0830  NA 140 139  K 5.2 4.1  CL 105 101  CO2 29 23  GLUCOSE 110* 134*  BUN 11 21  CREATININE 0.72 0.78  CALCIUM 9.5 9.4   Liver Function Tests:  Recent Labs Lab 02/25/13 0906  AST 16  ALT 23  ALKPHOS 63  BILITOT 0.9  PROT 6.8  ALBUMIN 4.5   No results found for this basename: LIPASE, AMYLASE,  in the last 168 hours No results found for this basename: AMMONIA,  in the last 168 hours CBC:  Recent Labs Lab 03/02/13 0830  WBC 7.5  NEUTROABS 4.7  HGB 14.6  HCT 41.9  MCV 94.2  PLT 292   Cardiac Enzymes: No results found for this basename: CKTOTAL, CKMB, CKMBINDEX, TROPONINI,  in the last 168 hours  BNP (last 3 results) No results found for this basename: PROBNP,  in the last 8760 hours CBG: No results found for this basename: GLUCAP,  in the last 168 hours  Radiological Exams on Admission: Dg Chest 2 View  03/02/2013   CLINICAL DATA:  Chest pain  EXAM: CHEST  2 VIEW  COMPARISON:  May 16, 2008  FINDINGS: Lungs are clear. Heart size and pulmonary vascularity are normal. No adenopathy. No pneumothorax. No bone lesions.  IMPRESSION: No abnormality noted.   Electronically Signed   By: Bretta BangWilliam  Woodruff M.D.   On: 03/02/2013 08:42    EKG: NSR, no acute changes  Assessment/Plan Active Problems:   HYPERLIPIDEMIA-MIXED   ANXIETY   HYPERTENSION, BENIGN   DYSPEPSIA&OTHER SPEC DISORDERS FUNCTION STOMACH   Palpitations   Chest pain   1. Chest pain: description is typical for angina, so far EKG is normal, initial troponin is negative. Given chest pain and shortness of breath with exertion would consider inpatient stress testing if ACS work up is negative. Check hepatic function labs to ro cholycystitis. 2. Hypertension: controled, continue lisinopril and lopressor 3. Hyperlipidemia: continue statin 4. GERD: continue PPI 5. Anxiety: continue celexa  Code Status: FULL Family Communication: spoke with patient and husband Disposition Plan: obs, anticipate dc  in am  Time spent: 40 minutes  [facility]rl[facility] Murphy Medical CenterWALSH,Akin Yi [facility] Pager [phone]

## 2013-03-02 NOTE — ED Provider Notes (Signed)
CSN: [id number]     Arrival date & time 03/02/13  0807 History   First MD Initiated Contact with Patient 03/02/13 [phone]     Chief Complaint  Patient presents with  . Chest Pain  . Shortness of Breath   (Consider location/radiation/quality/duration/timing/severity/associated sxs/prior Treatment) Patient is a 48 y.o. female presenting with chest pain and shortness of breath. The history is provided by the patient.  Chest Pain Pain location:  Substernal area Pain quality: pressure   Pain radiates to:  Does not radiate Pain radiates to the back: no   Pain severity:  Mild Onset quality:  Sudden Timing:  Constant Progression:  Unchanged Chronicity:  New Context: at rest   Relieved by:  Nothing Worsened by:  Exertion Ineffective treatments:  None tried Associated symptoms: dizziness, nausea and shortness of breath   Associated symptoms: no fever and not vomiting   Shortness of Breath Associated symptoms: chest pain   Associated symptoms: no fever and no vomiting     Past Medical History  Diagnosis Date  . Metabolic syndrome   . Hyperlipidemia   . Hypertension    History reviewed. No pertinent past surgical history. Family History  Problem Relation Age of Onset  . Diabetes Father   . Vasculitis Father    History  Substance Use Topics  . Smoking status: Never Smoker   . Smokeless tobacco: Not on file  . Alcohol Use: No   OB History   Grav Para Term Preterm Abortions TAB SAB Ect Mult Living                 Review of Systems  Constitutional: Negative for fever.  Respiratory: Positive for shortness of breath.   Cardiovascular: Positive for chest pain.  Gastrointestinal: Positive for nausea. Negative for vomiting.  Neurological: Positive for dizziness.  All other systems reviewed and are negative.    Allergies  Niacin and related  Home Medications   Current Outpatient Rx  Name  Route  Sig  Dispense  Refill  . citalopram (CELEXA) 20 MG tablet   Oral   Take 1  tablet (20 mg total) by mouth daily.   30 tablet   5   . lisinopril (PRINIVIL,ZESTRIL) 20 MG tablet   Oral   Take 1 tablet (20 mg total) by mouth daily.   30 tablet   5   . metoprolol (LOPRESSOR) 50 MG tablet      TAKE 1 TABLET BY MOUTH TWO TIMES A DAY   60 tablet   11   . pravastatin (PRAVACHOL) 20 MG tablet   Oral   Take 1 tablet (20 mg total) by mouth daily.   30 tablet   5    BP 153/89  Pulse 96  Temp(Src) 98.1 F (36.7 C) (Oral)  Resp 14  Ht 5\' 4"  (1.626 m)  Wt 165 lb (74.844 kg)  BMI 28.31 kg/m2  SpO2 98% Physical Exam  Nursing note and vitals reviewed. Constitutional: She is oriented to person, place, and time. She appears well-developed and well-nourished. No distress.  HENT:  Head: Normocephalic and atraumatic.  Eyes: EOM are normal. Pupils are equal, round, and reactive to light.  Neck: Normal range of motion. Neck supple.  Cardiovascular: Normal rate and regular rhythm.  Exam reveals no friction rub.   No murmur heard. Pulmonary/Chest: Effort normal and breath sounds normal. No respiratory distress. She has no wheezes. She has no rales.  Abdominal: Soft. She exhibits no distension. There is no tenderness. There is no  rebound.  Musculoskeletal: Normal range of motion. She exhibits no edema.  Neurological: She is alert and oriented to person, place, and time.  Skin: No rash noted. She is not diaphoretic.    ED Course  Procedures (including critical care time) Labs Review Labs Reviewed  CBC WITH DIFFERENTIAL  BASIC METABOLIC PANEL  POCT I-STAT TROPONIN I   Imaging Review Dg Chest 2 View  03/02/2013   CLINICAL DATA:  Chest pain  EXAM: CHEST  2 VIEW  COMPARISON:  May 16, 2008  FINDINGS: Lungs are clear. Heart size and pulmonary vascularity are normal. No adenopathy. No pneumothorax. No bone lesions.  IMPRESSION: No abnormality noted.   Electronically Signed   By: Bretta Bang M.D.   On: 03/02/2013 08:42    EKG Interpretation     Date/Time:  Tuesday March 02 2013 08:09:39 EST Ventricular Rate:  80 PR Interval:  164 QRS Duration: 76 QT Interval:  404 QTC Calculation: 465 R Axis:   19 Text Interpretation:  Normal sinus rhythm Indeterminate axis Similar to prior Confirmed by [facility]  MD, Raiven Belizaire ([id number]) on 03/02/2013 8:26:12 AM            MDM   1. Chest pain    34F with hx of HTN, HLD presents with chest pain concerning for ACS. Reports recent DOE with heavy exercise, which is new. Chest heaviness began while showering this morning without radiation/allevating/exacerbating factors. Patient has risk factors of HTN and HLD. 2/10 here. Initial EKG and troponin normal. Patient given aspirin and NTG. CP relieved after ASA and NTG, nitro paste applied. Admitted to medicine.    Dagmar Hait, MD 03/02/13 [phone]

## 2013-03-02 NOTE — ED Notes (Signed)
Admitting MD at bedside.

## 2013-03-03 DIAGNOSIS — I10 Essential (primary) hypertension: Secondary | ICD-10-CM

## 2013-03-03 LAB — CBC
HCT: 42.6 % (ref 36.0–46.0)
Hemoglobin: 14.4 g/dL (ref 12.0–15.0)
MCH: 32.4 pg (ref 26.0–34.0)
MCHC: 33.8 g/dL (ref 30.0–36.0)
MCV: 95.9 fL (ref 78.0–100.0)
Platelets: 334 10*3/uL (ref 150–400)
RBC: 4.44 MIL/uL (ref 3.87–5.11)
RDW: 13 % (ref 11.5–15.5)
WBC: 12 10*3/uL — ABNORMAL HIGH (ref 4.0–10.5)

## 2013-03-03 LAB — BASIC METABOLIC PANEL
BUN: 18 mg/dL (ref 6–23)
CALCIUM: 9.8 mg/dL (ref 8.4–10.5)
CO2: 24 mEq/L (ref 19–32)
Chloride: 98 mEq/L (ref 96–112)
Creatinine, Ser: 0.82 mg/dL (ref 0.50–1.10)
GFR calc Af Amer: >90 mL/min (ref >90)
GFR, EST NON AFRICAN AMERICAN: 83 mL/min — AB (ref >90)
Glucose, Bld: 106 mg/dL — ABNORMAL HIGH (ref 70–99)
Potassium: 4.2 mEq/L (ref 3.7–5.3)
Sodium: 137 mEq/L (ref 137–147)

## 2013-03-03 LAB — D-DIMER, QUANTITATIVE (NOT AT ARMC)

## 2013-03-03 MED ORDER — ALBUTEROL SULFATE HFA 108 (90 BASE) MCG/ACT IN AERS
2.0000 | INHALATION_SPRAY | Freq: Four times a day (QID) | RESPIRATORY_TRACT | Status: DC | PRN
  Administered 2013-03-03: 2 via RESPIRATORY_TRACT
  Filled 2013-03-03: qty 6.7

## 2013-03-03 MED ORDER — ALBUTEROL SULFATE HFA 108 (90 BASE) MCG/ACT IN AERS: 2.0000 | INHALATION_SPRAY | Freq: Four times a day (QID) | RESPIRATORY_TRACT | Status: DC | PRN

## 2013-03-03 MED ORDER — BENZONATATE 100 MG PO CAPS: 100.0000 mg | ORAL_CAPSULE | Freq: Three times a day (TID) | ORAL | Status: DC | PRN

## 2013-03-03 MED ORDER — AMOXICILLIN-POT CLAVULANATE 875-125 MG PO TABS
1.0000 | ORAL_TABLET | Freq: Two times a day (BID) | ORAL | Status: DC
  Administered 2013-03-03: 1 via ORAL
  Filled 2013-03-03 (×2): qty 1

## 2013-03-03 MED ORDER — ALBUTEROL SULFATE (2.5 MG/3ML) 0.083% IN NEBU: 3.0000 mL | INHALATION_SOLUTION | Freq: Once | RESPIRATORY_TRACT | Status: DC

## 2013-03-03 MED ORDER — GUAIFENESIN ER 600 MG PO TB12: 600.0000 mg | ORAL_TABLET | Freq: Two times a day (BID) | ORAL | Status: DC

## 2013-03-03 MED ORDER — BENZONATATE 100 MG PO CAPS
100.0000 mg | ORAL_CAPSULE | Freq: Three times a day (TID) | ORAL | Status: DC | PRN
  Administered 2013-03-03: 100 mg via ORAL
  Filled 2013-03-03: qty 1

## 2013-03-03 MED ORDER — AMOXICILLIN-POT CLAVULANATE 875-125 MG PO TABS: 1.0000 | ORAL_TABLET | Freq: Two times a day (BID) | ORAL | Status: DC

## 2013-03-03 MED ORDER — ASPIRIN EC 81 MG PO TBEC: 81.0000 mg | DELAYED_RELEASE_TABLET | Freq: Every day | ORAL | Status: DC

## 2013-03-03 MED ORDER — ALBUTEROL SULFATE (2.5 MG/3ML) 0.083% IN NEBU: 2.5000 mg | INHALATION_SOLUTION | Freq: Four times a day (QID) | RESPIRATORY_TRACT | Status: DC | PRN

## 2013-03-03 MED ORDER — GUAIFENESIN ER 600 MG PO TB12
600.0000 mg | ORAL_TABLET | Freq: Two times a day (BID) | ORAL | Status: DC
  Administered 2013-03-03: 600 mg via ORAL
  Filled 2013-03-03 (×2): qty 1

## 2013-03-03 MED ORDER — NITROGLYCERIN 0.4 MG SL SUBL: 0.4000 mg | SUBLINGUAL_TABLET | SUBLINGUAL | Status: DC | PRN

## 2013-03-03 NOTE — Discharge Instructions (Signed)
Chest Pain (Nonspecific) °It is often hard to give a specific diagnosis for the cause of chest pain. There is always a chance that your pain could be related to something serious, such as a heart attack or a blood clot in the lungs. You need to follow up with your caregiver for further evaluation. °CAUSES  °· Heartburn. °· Pneumonia or bronchitis. °· Anxiety or stress. °· Inflammation around your heart (pericarditis) or lung (pleuritis or pleurisy). °· A blood clot in the lung. °· A collapsed lung (pneumothorax). It can develop suddenly on its own (spontaneous pneumothorax) or from injury (trauma) to the chest. °· Shingles infection (herpes zoster virus). °The chest wall is composed of bones, muscles, and cartilage. Any of these can be the source of the pain. °· The bones can be bruised by injury. °· The muscles or cartilage can be strained by coughing or overwork. °· The cartilage can be affected by inflammation and become sore (costochondritis). °DIAGNOSIS  °Lab tests or other studies, such as X-rays, electrocardiography, stress testing, or cardiac imaging, may be needed to find the cause of your pain.  °TREATMENT  °· Treatment depends on what may be causing your chest pain. Treatment may include: °· Acid blockers for heartburn. °· Anti-inflammatory medicine. °· Pain medicine for inflammatory conditions. °· Antibiotics if an infection is present. °· You may be advised to change lifestyle habits. This includes stopping smoking and avoiding alcohol, caffeine, and chocolate. °· You may be advised to keep your head raised (elevated) when sleeping. This reduces the chance of acid going backward from your stomach into your esophagus. °· Most of the time, nonspecific chest pain will improve within 2 to 3 days with rest and mild pain medicine. °HOME CARE INSTRUCTIONS  °· If antibiotics were prescribed, take your antibiotics as directed. Finish them even if you start to feel better. °· For the next few days, avoid physical  activities that bring on chest pain. Continue physical activities as directed. °· Do not smoke. °· Avoid drinking alcohol. °· Only take over-the-counter or prescription medicine for pain, discomfort, or fever as directed by your caregiver. °· Follow your caregiver's suggestions for further testing if your chest pain does not go away. °· Keep any follow-up appointments you made. If you do not go to an appointment, you could develop lasting (chronic) problems with pain. If there is any problem keeping an appointment, you must call to reschedule. °SEEK MEDICAL CARE IF:  °· You think you are having problems from the medicine you are taking. Read your medicine instructions carefully. °· Your chest pain does not go away, even after treatment. °· You develop a rash with blisters on your chest. °SEEK IMMEDIATE MEDICAL CARE IF:  °· You have increased chest pain or pain that spreads to your arm, neck, jaw, back, or abdomen. °· You develop shortness of breath, an increasing cough, or you are coughing up blood. °· You have severe back or abdominal pain, feel nauseous, or vomit. °· You develop severe weakness, fainting, or chills. °· You have a fever. °THIS IS AN EMERGENCY. Do not wait to see if the pain will go away. Get medical help at once. Call your local emergency services (911 in [place] Do not drive yourself to the hospital. °MAKE SURE YOU:  °· Understand these instructions. °· Will watch your condition. °· Will get help right away if you are not doing well or get worse. °Document Released: 11/14/2004 Document Revised: 04/29/2011 Document Reviewed: 09/10/2007 °[employer]® Patient Information ©2014 [employer]   [employer]. ° °

## 2013-03-03 NOTE — Discharge Summary (Signed)
Physician Discharge Summary  Patient ID: MILAYAH KRELL MRN: [id number] DOB/AGE: 02-28-1965 48 y.o.  Admit date: 03/02/2013 Discharge date: 03/03/2013  Primary Care Physician:  Lynnea Ferrier TOM, MD  Discharge Diagnoses:     Atypical chest pain with dyspnea - improving   Acute bronchitis  . HYPERLIPIDEMIA-MIXED . ANXIETY . HYPERTENSION, BENIGN . DYSPEPSIA&OTHER SPEC DISORDERS FUNCTION STOMACH . Palpitations  Consults: Cardiology, Dr Royann Shivers   Recommendations for Outpatient Follow-up:  Patient was recommended to followup with pulmonology and possibly an outpatient 2-D echo if patient continues to have symptoms after antibiotics and albuterol inhaler  Allergies:   Allergies  Allergen Reactions  . Niacin And Related Other (See Comments)    Passes out     Discharge Medications:   Medication List         albuterol 108 (90 BASE) MCG/ACT inhaler  Commonly known as:  PROVENTIL HFA;VENTOLIN HFA  Inhale 2 puffs into the lungs every 6 (six) hours as needed for wheezing or shortness of breath.     amoxicillin-clavulanate 875-125 MG per tablet  Commonly known as:  AUGMENTIN  Take 1 tablet by mouth 2 (two) times daily. X 10days     aspirin EC 81 MG tablet  Take 1 tablet (81 mg total) by mouth daily.     benzonatate 100 MG capsule  Commonly known as:  TESSALON  Take 1 capsule (100 mg total) by mouth 3 (three) times daily as needed for cough.     citalopram 20 MG tablet  Commonly known as:  CELEXA  Take 20 mg by mouth at bedtime.     guaiFENesin 600 MG 12 hr tablet  Commonly known as:  MUCINEX  Take 1 tablet (600 mg total) by mouth 2 (two) times daily.     lisinopril 20 MG tablet  Commonly known as:  PRINIVIL,ZESTRIL  Take 1 tablet (20 mg total) by mouth daily.     metoprolol 50 MG tablet  Commonly known as:  LOPRESSOR  Take 50 mg by mouth 2 (two) times daily.     nitroGLYCERIN 0.4 MG SL tablet  Commonly known as:  NITROSTAT  Place 1 tablet (0.4 mg total)  under the tongue every 5 (five) minutes as needed for chest pain.     pravastatin 20 MG tablet  Commonly known as:  PRAVACHOL  Take 20 mg by mouth at bedtime.         Brief H and P: For complete details please refer to admission H and P, but in brief patient is a 48 year old female with history of hypertension, hyperlipidemia, mild hyperglycemia presented with chest pain. She reported the past 2 weeks she was trying to exercise more, became more short of breath and lower chest tightness with exertion. On the morning of admission, while getting out of the shower she felt chest tightness and heaviness, short breath and nauseated at the same time presented to the ER for further workup.  Hospital Course:   Atypical chest pain with shortness of breath: Patient was admitted for rule out acute ACS. Patient also reported that she had been trying to increase her exercise regimen recently and had been under the weather for chest congestion. Possibly could have underlying deconditioning and exacerbated by acute bronchitis. Parkinson's remained negative, d-dimer was also normal. LFTs were normal. EKGs were essentially normal except a mild T-wave inversion in aVL. Cardiology consult was obtained and patient underwent exercise tolerance test. Per Dr. Erin Hearing (cardiology) report, the Patient was cleared from cardiology stand point for discharge.  Patient also reported coughing and mild wheezing after the treadmill test. She was treated with nebulizer treatment. She was given a prescription for albuterol inhaler , Augmentin and tesselon for coughing. I recommended her to follow up with her primary physician in next 2 weeks. If she continues to have the symptoms, may need referral to pulmonology for further workup.  Day of Discharge BP 114/67  Pulse 63  Temp(Src) 98.6 F (37 C) (Oral)  Resp 18  Ht 5\' 3"  (1.6 m)  Wt 73.664 kg (162 lb 6.4 oz)  BMI 28.78 kg/m2  SpO2 98%  Physical Exam: General: Alert  and awake oriented x3 not in any acute distress. HEENT: anicteric sclera, pupils reactive to light and accommodation CVS: S1-S2 clear no murmur rubs or gallops Chest: clear to auscultation bilaterally, no wheezing rales or rhonchi Abdomen: soft nontender, nondistended, normal bowel sounds Extremities: no cyanosis, clubbing or edema noted bilaterally Neuro: Cranial nerves II-XII intact, no focal neurological deficits   The results of significant diagnostics from this hospitalization (including imaging, microbiology, ancillary and laboratory) are listed below for reference.    LAB RESULTS: Basic Metabolic Panel:  Recent Labs Lab 03/02/13 0830 03/02/13 1350 03/03/13 0307  NA 139  --  137  K 4.1  --  4.2  CL 101  --  98  CO2 23  --  24  GLUCOSE 134*  --  106*  BUN 21  --  18  CREATININE 0.78 0.72 0.82  CALCIUM 9.4  --  9.8   Liver Function Tests:  Recent Labs Lab 02/25/13 0906 03/02/13 1350  AST 16 18  ALT 23 22  ALKPHOS 63 67  BILITOT 0.9 1.1  PROT 6.8 7.6  ALBUMIN 4.5 4.3   No results found for this basename: LIPASE, AMYLASE,  in the last 168 hours No results found for this basename: AMMONIA,  in the last 168 hours CBC:  Recent Labs Lab 03/02/13 0830 03/02/13 1350 03/03/13 0307  WBC 7.5 9.7 12.0*  NEUTROABS 4.7  --   --   HGB 14.6 14.5 14.4  HCT 41.9 42.0 42.6  MCV 94.2 94.2 95.9  PLT 292 329 334   Cardiac Enzymes:  Recent Labs Lab 03/02/13 1615 03/02/13 2250  TROPONINI <0.30 <0.30   BNP: No components found with this basename: POCBNP,  CBG: No results found for this basename: GLUCAP,  in the last 168 hours  Significant Diagnostic Studies:  Dg Chest 2 View  03/02/2013   CLINICAL DATA:  Chest pain  EXAM: CHEST  2 VIEW  COMPARISON:  May 16, 2008  FINDINGS: Lungs are clear. Heart size and pulmonary vascularity are normal. No adenopathy. No pneumothorax. No bone lesions.  IMPRESSION: No abnormality noted.   Electronically Signed   By: Bretta BangWilliam   Woodruff M.D.   On: 03/02/2013 08:42       Disposition and Follow-up:     Discharge Orders   Future Orders Complete By Expires   Diet - low sodium heart healthy  As directed    Increase activity slowly  As directed        DISPOSITION: Home  DIET: Heart healthy diet  DISCHARGE FOLLOW-UP Follow-up Information   [facility]. Schedule an appointment as soon as possible for a visit in 2 weeks. (for hospital follow-up)    Specialty:  Family Medicine   Contact information:   [address] [place] [id number] [phone]       Time spent on Discharge:  35 mins  Signed:   Tavarius Grewe M.D. [facility] 03/03/2013, 2:48 PM Pager: [phone]

## 2013-03-03 NOTE — Consult Note (Signed)
Reason for Consult: Chest pain  Requesting Physician: Rai  Cardiologist: None  HPI: This is a 49 y.o. female with a past medical history significant for HTN and hyperlipidemia, recently trying to increase her exercise regimen. Has noted dyspnea walking uphill, but (to me) denied exertional chest ppain. Yesterday had headache, palpitations and chest tightness, all associated with marked diastolic hypertension. States she has complied with medications and there is no clear trigger for accelerated HTN. Here her BP has been normal on her usual meds, her ecg is normal, as are the cardiac enzymes. She is now asymptomatic.  PMHx:  Past Medical History  Diagnosis Date  . Metabolic syndrome   . Hyperlipidemia   . Hypertension    History reviewed. No pertinent past surgical history.  FAMHx: Family History  Problem Relation Age of Onset  . Diabetes Father   . Vasculitis Father     SOCHx:  reports that she has never smoked. She does not have any smokeless tobacco history on file. She reports that she does not drink alcohol or use illicit drugs.  ALLERGIES: Allergies  Allergen Reactions  . Niacin And Related Other (See Comments)    Passes out    ROS: Constitutional: negative for anorexia, chills, fevers, sweats and weight loss Eyes: negative Ears, nose, mouth, throat, and face: negative Respiratory: positive for dyspnea on exertion, negative for cough, hemoptysis, sputum and wheezing Cardiovascular: positive for chest pressure/discomfort and dyspnea, negative for claudication, orthopnea, paroxysmal nocturnal dyspnea and syncope Gastrointestinal: negative for abdominal pain, constipation, diarrhea, jaundice, melena, nausea, reflux symptoms and vomiting Genitourinary:negative Integument/breast: negative Hematologic/lymphatic: negative for bleeding, easy bruising and petechiae Musculoskeletal:positive for arthralgias Neurological: negative for dizziness, seizures, speech  problems and weakness Behavioral/Psych: negative Endocrine: negative for diabetic symptoms including polydipsia, polyphagia and polyuria Allergic/Immunologic: negative  HOME MEDICATIONS: Prescriptions prior to admission  Medication Sig Dispense Refill  . citalopram (CELEXA) 20 MG tablet Take 20 mg by mouth at bedtime.      Marland Kitchen lisinopril (PRINIVIL,ZESTRIL) 20 MG tablet Take 1 tablet (20 mg total) by mouth daily.  30 tablet  5  . metoprolol (LOPRESSOR) 50 MG tablet Take 50 mg by mouth 2 (two) times daily.      . pravastatin (PRAVACHOL) 20 MG tablet Take 20 mg by mouth at bedtime.        HOSPITAL MEDICATIONS: I have reviewed the patient's current medications. Prior to Admission:  Prescriptions prior to admission  Medication Sig Dispense Refill  . citalopram (CELEXA) 20 MG tablet Take 20 mg by mouth at bedtime.      Marland Kitchen lisinopril (PRINIVIL,ZESTRIL) 20 MG tablet Take 1 tablet (20 mg total) by mouth daily.  30 tablet  5  . metoprolol (LOPRESSOR) 50 MG tablet Take 50 mg by mouth 2 (two) times daily.      . pravastatin (PRAVACHOL) 20 MG tablet Take 20 mg by mouth at bedtime.        VITALS: Blood pressure 118/68, pulse 57, temperature 98.7 F (37.1 C), temperature source Oral, resp. rate 18, height 5' 3"  (1.6 m), weight 162 lb 6.4 oz (73.664 kg), SpO2 98.00%.  PHYSICAL EXAM:  General: Alert, oriented x3, no distress Head: no evidence of trauma, PERRL, EOMI, no exophtalmos or lid lag, no myxedema, no xanthelasma; normal ears, nose and oropharynx Neck: normal jugular venous pulsations and no hepatojugular reflux; brisk carotid pulses without delay and no carotid bruits Chest: clear to auscultation, no signs of consolidation by percussion or palpation, normal fremitus,  symmetrical and full respiratory excursions Cardiovascular: normal position and quality of the apical impulse, regular rhythm, normal first and second heart sounds, no murmurs, rubs or gallops Abdomen: no tenderness or  distention, no masses by palpation, no abnormal pulsatility or arterial bruits, normal bowel sounds, no hepatosplenomegaly Extremities: no clubbing, cyanosis or edema; 2+ radial, ulnar and brachial pulses bilaterally; 2+ right femoral, posterior tibial and dorsalis pedis pulses; 2+ left femoral, posterior tibial and dorsalis pedis pulses; no subclavian or femoral bruits Neurological: grossly nonfocal   LABS: Results for orders placed during the hospital encounter of 03/02/13 (from the past 48 hour(s))  CBC WITH DIFFERENTIAL     Status: None   Collection Time    03/02/13  8:30 AM      Result Value Range   WBC 7.5  4.0 - 10.5 K/uL   RBC 4.45  3.87 - 5.11 MIL/uL   Hemoglobin 14.6  12.0 - 15.0 g/dL   HCT 41.9  36.0 - 46.0 %   MCV 94.2  78.0 - 100.0 fL   MCH 32.8  26.0 - 34.0 pg   MCHC 34.8  30.0 - 36.0 g/dL   RDW 12.7  11.5 - 15.5 %   Platelets 292  150 - 400 K/uL   Neutrophils Relative % 63  43 - 77 %   Neutro Abs 4.7  1.7 - 7.7 K/uL   Lymphocytes Relative 28  12 - 46 %   Lymphs Abs 2.1  0.7 - 4.0 K/uL   Monocytes Relative 8  3 - 12 %   Monocytes Absolute 0.6  0.1 - 1.0 K/uL   Eosinophils Relative 1  0 - 5 %   Eosinophils Absolute 0.1  0.0 - 0.7 K/uL   Basophils Relative 0  0 - 1 %   Basophils Absolute 0.0  0.0 - 0.1 K/uL  BASIC METABOLIC PANEL     Status: Abnormal   Collection Time    03/02/13  8:30 AM      Result Value Range   Sodium 139  137 - 147 mEq/L   Potassium 4.1  3.7 - 5.3 mEq/L   Chloride 101  96 - 112 mEq/L   CO2 23  19 - 32 mEq/L   Glucose, Bld 134 (*) 70 - 99 mg/dL   BUN 21  6 - 23 mg/dL   Creatinine, Ser 0.78  0.50 - 1.10 mg/dL   Calcium 9.4  8.4 - 10.5 mg/dL   GFR calc non Af Amer >90  >90 mL/min   GFR calc Af Amer >90  >90 mL/min   Comment: (NOTE)     The eGFR has been calculated using the CKD EPI equation.     This calculation has not been validated in all clinical situations.     eGFR's persistently <90 mL/min signify possible Chronic Kidney      Disease.  POCT I-STAT TROPONIN I     Status: None   Collection Time    03/02/13  8:41 AM      Result Value Range   Troponin i, poc 0.00  0.00 - 0.08 ng/mL   Comment 3            Comment: Due to the release kinetics of cTnI,     a negative result within the first hours     of the onset of symptoms does not rule out     myocardial infarction with certainty.     If myocardial infarction is still suspected,  repeat the test at appropriate intervals.  TROPONIN I     Status: None   Collection Time    03/02/13 10:36 AM      Result Value Range   Troponin I <0.30  <0.30 ng/mL   Comment:            Due to the release kinetics of cTnI,     a negative result within the first hours     of the onset of symptoms does not rule out     myocardial infarction with certainty.     If myocardial infarction is still suspected,     repeat the test at appropriate intervals.  TSH     Status: None   Collection Time    03/02/13 10:42 AM      Result Value Range   TSH 1.318  0.350 - 4.500 uIU/mL   Comment: Performed at [facility]     Status: None   Collection Time    03/02/13  1:50 PM      Result Value Range   Total Protein 7.6  6.0 - 8.3 g/dL   Albumin 4.3  3.5 - 5.2 g/dL   AST 18  0 - 37 U/L   ALT 22  0 - 35 U/L   Alkaline Phosphatase 67  39 - 117 U/L   Total Bilirubin 1.1  0.3 - 1.2 mg/dL   Bilirubin, Direct <0.2  0.0 - 0.3 mg/dL   Indirect Bilirubin NOT CALCULATED  0.3 - 0.9 mg/dL  CBC     Status: None   Collection Time    03/02/13  1:50 PM      Result Value Range   WBC 9.7  4.0 - 10.5 K/uL   RBC 4.46  3.87 - 5.11 MIL/uL   Hemoglobin 14.5  12.0 - 15.0 g/dL   HCT 42.0  36.0 - 46.0 %   MCV 94.2  78.0 - 100.0 fL   MCH 32.5  26.0 - 34.0 pg   MCHC 34.5  30.0 - 36.0 g/dL   RDW 12.7  11.5 - 15.5 %   Platelets 329  150 - 400 K/uL  CREATININE, SERUM     Status: None   Collection Time    03/02/13  1:50 PM      Result Value Range   Creatinine, Ser 0.72   0.50 - 1.10 mg/dL   GFR calc non Af Amer >90  >90 mL/min   GFR calc Af Amer >90  >90 mL/min   Comment: (NOTE)     The eGFR has been calculated using the CKD EPI equation.     This calculation has not been validated in all clinical situations.     eGFR's persistently <90 mL/min signify possible Chronic Kidney     Disease.  TROPONIN I     Status: None   Collection Time    03/02/13  4:15 PM      Result Value Range   Troponin I <0.30  <0.30 ng/mL   Comment:            Due to the release kinetics of cTnI,     a negative result within the first hours     of the onset of symptoms does not rule out     myocardial infarction with certainty.     If myocardial infarction is still suspected,     repeat the test at appropriate intervals.  TROPONIN I     Status: None  Collection Time    03/02/13 10:50 PM      Result Value Range   Troponin I <0.30  <0.30 ng/mL   Comment:            Due to the release kinetics of cTnI,     a negative result within the first hours     of the onset of symptoms does not rule out     myocardial infarction with certainty.     If myocardial infarction is still suspected,     repeat the test at appropriate intervals.  BASIC METABOLIC PANEL     Status: Abnormal   Collection Time    03/03/13  3:07 AM      Result Value Range   Sodium 137  137 - 147 mEq/L   Potassium 4.2  3.7 - 5.3 mEq/L   Chloride 98  96 - 112 mEq/L   CO2 24  19 - 32 mEq/L   Glucose, Bld 106 (*) 70 - 99 mg/dL   BUN 18  6 - 23 mg/dL   Creatinine, Ser 0.82  0.50 - 1.10 mg/dL   Calcium 9.8  8.4 - 10.5 mg/dL   GFR calc non Af Amer 83 (*) >90 mL/min   GFR calc Af Amer >90  >90 mL/min   Comment: (NOTE)     The eGFR has been calculated using the CKD EPI equation.     This calculation has not been validated in all clinical situations.     eGFR's persistently <90 mL/min signify possible Chronic Kidney     Disease.  CBC     Status: Abnormal   Collection Time    03/03/13  3:07 AM      Result Value  Range   WBC 12.0 (*) 4.0 - 10.5 K/uL   RBC 4.44  3.87 - 5.11 MIL/uL   Hemoglobin 14.4  12.0 - 15.0 g/dL   HCT 42.6  36.0 - 46.0 %   MCV 95.9  78.0 - 100.0 fL   MCH 32.4  26.0 - 34.0 pg   MCHC 33.8  30.0 - 36.0 g/dL   RDW 13.0  11.5 - 15.5 %   Platelets 334  150 - 400 K/uL    IMAGING: Dg Chest 2 View  03/02/2013   CLINICAL DATA:  Chest pain  EXAM: CHEST  2 VIEW  COMPARISON:  May 16, 2008  FINDINGS: Lungs are clear. Heart size and pulmonary vascularity are normal. No adenopathy. No pneumothorax. No bone lesions.  IMPRESSION: No abnormality noted.   Electronically Signed   By: Lowella Grip M.D.   On: 03/02/2013 08:42    IMPRESSION: 1. Exertional dyspnea may reflect deconditioning or structural cardiac problems 2. Her symptoms appear related to marked increase in BP, although this is unexplained. 3. Low risk for ACS: asymptomatic now, normal ECG and enzymes. 4. Low to intermediate risk factor profile. Recommend stress ECG - monitor for exertional HTN, ECG changes of ischemia  Time Spent Directly with Patient: 45 minutes  Sanda Klein, MD, [facility] [phone] office [phone] pager  03/03/2013, 10:10 AM

## 2013-03-16 ENCOUNTER — Other Ambulatory Visit: Payer: Self-pay | Admitting: Family Medicine

## 2013-03-16 NOTE — Telephone Encounter (Signed)
RF sent on 1/8 + 5  Confirmed by calling pharmacy

## 2013-05-12 LAB — HM PAP SMEAR

## 2013-05-18 ENCOUNTER — Ambulatory Visit (INDEPENDENT_AMBULATORY_CARE_PROVIDER_SITE_OTHER): Payer: BC Managed Care – PPO | Admitting: Family Medicine

## 2013-05-18 ENCOUNTER — Encounter: Payer: Self-pay | Admitting: Family Medicine

## 2013-05-18 VITALS — BP 140/84 | HR 60 | Temp 99.0°F | Resp 16 | Ht 63.25 in | Wt 154.0 lb

## 2013-05-18 DIAGNOSIS — R0989 Other specified symptoms and signs involving the circulatory and respiratory systems: Secondary | ICD-10-CM

## 2013-05-18 DIAGNOSIS — R0609 Other forms of dyspnea: Secondary | ICD-10-CM

## 2013-05-18 NOTE — Progress Notes (Signed)
Subjective:    Patient ID: [name], female    DOB: November 07, 1965, 48 y.o.   MRN: [id number]  HPI Patient went to the hospital with chest pain and dyspnea on exertion in January. A stress test performed at the hospital was normal. The patient was given samples of albuterol because an outpatient. She's not noticed any improvement using the albuterol as needed. He does it once a day. She denies any pleurisy or hemoptysis. She does have postnasal drip and rhinitis due to allergies. She states that she is getting better and more easily winded with exertion. She also notices wheezing with exertion. She denies any acid reflux. She is currently taking lisinopril and does have a slight daily cough.  She's currently taking Allegra 180 mg by mouth daily for allergies. Her family history is significant for a father with Wegener's granulomatosis.  Pulmonary function test today in the office reveal an FEV1 percentage of 85 with an FVC of 85%. Past Medical History  Diagnosis Date  . Metabolic syndrome   . Hyperlipidemia   . Hypertension    Current Outpatient Prescriptions on File Prior to Visit  Medication Sig Dispense Refill  . albuterol (PROVENTIL HFA;VENTOLIN HFA) 108 (90 BASE) MCG/ACT inhaler Inhale 2 puffs into the lungs every 6 (six) hours as needed for wheezing or shortness of breath.  1 Inhaler  3  . citalopram (CELEXA) 20 MG tablet Take 20 mg by mouth at bedtime.      Marland Kitchen. lisinopril (PRINIVIL,ZESTRIL) 20 MG tablet Take 1 tablet (20 mg total) by mouth daily.  30 tablet  5  . metoprolol (LOPRESSOR) 50 MG tablet Take 50 mg by mouth 2 (two) times daily.      . nitroGLYCERIN (NITROSTAT) 0.4 MG SL tablet Place 1 tablet (0.4 mg total) under the tongue every 5 (five) minutes as needed for chest pain.  30 tablet  12  . pravastatin (PRAVACHOL) 20 MG tablet Take 20 mg by mouth at bedtime.       No current facility-administered medications on file prior to visit.   Allergies  Allergen Reactions  .  Niacin And Related Other (See Comments)    Passes out   History   Social History  . Marital Status: Married    Spouse Name: N/A    Number of Children: N/A  . Years of Education: N/A   Occupational History  . Not on file.   Social History Main Topics  . Smoking status: Never Smoker   . Smokeless tobacco: Not on file  . Alcohol Use: No  . Drug Use: No  . Sexual Activity: Not on file   Other Topics Concern  . Not on file   Social History Narrative  . No narrative on file      Review of Systems  All other systems reviewed and are negative.       Objective:   Physical Exam  Vitals reviewed. Constitutional: She appears well-developed and well-nourished. No distress.  HENT:  Head: Normocephalic and atraumatic.  Right Ear: External ear normal.  Left Ear: External ear normal.  Nose: Nose normal.  Mouth/Throat: Oropharynx is clear and moist. No oropharyngeal exudate.  Eyes: Conjunctivae and EOM are normal. Pupils are equal, round, and reactive to light. Right eye exhibits no discharge. Left eye exhibits no discharge. No scleral icterus.  Neck: Neck supple. No JVD present. No thyromegaly present.  Cardiovascular: Normal rate, regular rhythm and normal heart sounds.  Exam reveals no gallop and no friction rub.  No murmur heard. Pulmonary/Chest: Effort normal and breath sounds normal. No respiratory distress. She has no wheezes. She has no rales.  Abdominal: Soft. Bowel sounds are normal. She exhibits no distension. There is no tenderness. There is no rebound and no guarding.  Lymphadenopathy:    She has no cervical adenopathy.  Skin: She is not diaphoretic.          Assessment & Plan:  1. Dyspnea on exertion Patient's primary function test are normal. She has had a normal chest x-ray as well as a normal stress test. Is no evidence of anemia. At this point the patient may have an element of asthma. Give her a trial Symbicort 160/4.5 2 puffs inhaled twice a day. I am  also going to maximize her treatment for allergies by adding Nasacort 2 sprays each nostril daily. I will also have the patient discontinue lisinopril and replace it with Benicar 20 mg by mouth daily recheck in 3 weeks to assess for improvement.

## 2013-06-08 ENCOUNTER — Encounter: Payer: Self-pay | Admitting: Family Medicine

## 2013-06-08 ENCOUNTER — Ambulatory Visit (INDEPENDENT_AMBULATORY_CARE_PROVIDER_SITE_OTHER): Payer: BC Managed Care – PPO | Admitting: Family Medicine

## 2013-06-08 VITALS — BP 126/74 | HR 64 | Temp 97.6°F | Resp 16 | Ht 63.25 in | Wt 151.0 lb

## 2013-06-08 DIAGNOSIS — R0609 Other forms of dyspnea: Secondary | ICD-10-CM

## 2013-06-08 DIAGNOSIS — E785 Hyperlipidemia, unspecified: Secondary | ICD-10-CM

## 2013-06-08 DIAGNOSIS — I10 Essential (primary) hypertension: Secondary | ICD-10-CM

## 2013-06-08 DIAGNOSIS — Z9109 Other allergy status, other than to drugs and biological substances: Secondary | ICD-10-CM

## 2013-06-08 DIAGNOSIS — R0989 Other specified symptoms and signs involving the circulatory and respiratory systems: Secondary | ICD-10-CM

## 2013-06-08 MED ORDER — LOSARTAN POTASSIUM 50 MG PO TABS
50.0000 mg | ORAL_TABLET | Freq: Every day | ORAL | Status: DC
Start: 2013-06-08 — End: 2014-01-20

## 2013-06-08 MED ORDER — PRAVASTATIN SODIUM 20 MG PO TABS
20.0000 mg | ORAL_TABLET | Freq: Every day | ORAL | Status: DC
Start: 2013-06-08 — End: 2013-12-07

## 2013-06-08 MED ORDER — MONTELUKAST SODIUM 10 MG PO TABS: 10.0000 mg | ORAL_TABLET | Freq: Every day | ORAL | Status: DC

## 2013-06-08 NOTE — Progress Notes (Signed)
Subjective:    Patient ID: [name], female    DOB: 02-11-1966, 48 y.o.   MRN: [id number]  HPI 05/18/13 Patient went to the hospital with chest pain and dyspnea on exertion in January. A stress test performed at the hospital was normal. The patient was given samples of albuterol.  She's not noticed any improvement using the albuterol as needed. She does it once a day. She denies any pleurisy or hemoptysis. She does have postnasal drip and rhinitis due to allergies. She states that she is getting better and more easily winded with exertion. She also notices wheezing with exertion. She denies any acid reflux. She is currently taking lisinopril and does have a slight daily cough.  She's currently taking Allegra 180 mg by mouth daily for allergies. Her family history is significant for a father with Wegener's granulomatosis.  Pulmonary function test today in the office reveal an FEV1 percentage of 85 with an FVC of 85%. At that time, my plan was: 1. Dyspnea on exertion Patient's pulmonary  function tests are normal. She has had a normal chest x-ray as well as a normal stress test. There is no evidence of anemia. At this point the patient may have an element of asthma. Give her a trial Symbicort 160/4.5 2 puffs inhaled twice a day. I am also going to maximize her treatment for allergies by adding Nasacort 2 sprays each nostril daily. I will also have the patient discontinue lisinopril and replace it with Benicar 20 mg by mouth daily recheck in 3 weeks to assess for improvement.  06/08/13 Patient states that after we made the above changes she is approximately 65 to 70% better. She did not notice dramatic improvement when she started Symbicort.  She ran out several days ago and has not noticed any decline in her breathing.  She does since she started the Nasonex and discontinued we'll set up that her breathing has improved dramatically. She continues to have congestion and other symptoms of allergies.  She is also overdue for a fasting lipid panel and CMP. She's made therapeutic lifestyle changes to try to address her low HDL and she would like to recheck that. Past Medical History  Diagnosis Date  . Metabolic syndrome   . Hyperlipidemia   . Hypertension    Current Outpatient Prescriptions on File Prior to Visit  Medication Sig Dispense Refill  . albuterol (PROVENTIL HFA;VENTOLIN HFA) 108 (90 BASE) MCG/ACT inhaler Inhale 2 puffs into the lungs every 6 (six) hours as needed for wheezing or shortness of breath.  1 Inhaler  3  . citalopram (CELEXA) 20 MG tablet Take 20 mg by mouth at bedtime.      . fexofenadine (ALLEGRA) 180 MG tablet Take 180 mg by mouth daily.      . metoprolol (LOPRESSOR) 50 MG tablet Take 50 mg by mouth 2 (two) times daily.      . nitroGLYCERIN (NITROSTAT) 0.4 MG SL tablet Place 1 tablet (0.4 mg total) under the tongue every 5 (five) minutes as needed for chest pain.  30 tablet  12  . pravastatin (PRAVACHOL) 20 MG tablet Take 20 mg by mouth at bedtime.       No current facility-administered medications on file prior to visit.   Allergies  Allergen Reactions  . Niacin And Related Other (See Comments)    Passes out   History   Social History  . Marital Status: Married    Spouse Name: N/A    Number of Children:  N/A  . Years of Education: N/A   Occupational History  . Not on file.   Social History Main Topics  . Smoking status: Never Smoker   . Smokeless tobacco: Not on file  . Alcohol Use: No  . Drug Use: No  . Sexual Activity: Not on file   Other Topics Concern  . Not on file   Social History Narrative  . No narrative on file      Review of Systems  All other systems reviewed and are negative.      Objective:   Physical Exam  Vitals reviewed. Constitutional: She appears well-developed and well-nourished. No distress.  HENT:  Head: Normocephalic and atraumatic.  Right Ear: External ear normal.  Left Ear: External ear normal.  Nose: Nose  normal.  Mouth/Throat: Oropharynx is clear and moist. No oropharyngeal exudate.  Eyes: Conjunctivae and EOM are normal. Pupils are equal, round, and reactive to light. Right eye exhibits no discharge. Left eye exhibits no discharge. No scleral icterus.  Neck: Neck supple. No JVD present. No thyromegaly present.  Cardiovascular: Normal rate, regular rhythm and normal heart sounds.  Exam reveals no gallop and no friction rub.   No murmur heard. Pulmonary/Chest: Effort normal and breath sounds normal. No respiratory distress. She has no wheezes. She has no rales.  Abdominal: Soft. Bowel sounds are normal. She exhibits no distension. There is no tenderness. There is no rebound and no guarding.  Lymphadenopathy:    She has no cervical adenopathy.  Skin: She is not diaphoretic.          Assessment & Plan:  1. HTN (hypertension) Readings seem to improve after she discontinued the ACE I. For cost reasons, I will discontinue Benicar and start the patient on losartan 50 mg by mouth daily - losartan (COZAAR) 50 MG tablet; Take 1 tablet (50 mg total) by mouth daily.  Dispense: 90 tablet; Refill: 3 - COMPLETE METABOLIC PANEL WITH GFR; Future - Lipid panel; Future  2. Environmental allergies I believe seasonal allergies are also playing a large role in her shortness of breath and dyspnea on exertion. Therefore I will add Singulair 10 mg by mouth daily. If her breathing does not improve, the next step will be to  consult an allergist for allergy testing - montelukast (SINGULAIR) 10 MG tablet; Take 1 tablet (10 mg total) by mouth at bedtime.  Dispense: 30 tablet; Refill: 3  3. Dyspnea on exertion Likely due to a combination of seasonal allergies and ACE inhibitor use. Discontinue Symbicort. Her breathing  worsens I would resume the Symbicort. Try to maximize her therapy for seasonal allergies. 4. Hyperlipidemia Return fasting for CMP and fasting lipid panel. - COMPLETE METABOLIC PANEL WITH GFR;  Future - Lipid panel; Future

## 2013-09-18 ENCOUNTER — Other Ambulatory Visit: Payer: Self-pay | Admitting: Physician Assistant

## 2013-09-20 NOTE — Telephone Encounter (Signed)
Refill appropriate and filled per protocol. 

## 2013-12-07 ENCOUNTER — Other Ambulatory Visit: Payer: Self-pay | Admitting: Family Medicine

## 2014-01-14 ENCOUNTER — Other Ambulatory Visit: Payer: Self-pay | Admitting: Family Medicine

## 2014-01-18 ENCOUNTER — Other Ambulatory Visit: Payer: BC Managed Care – PPO

## 2014-01-18 DIAGNOSIS — E785 Hyperlipidemia, unspecified: Secondary | ICD-10-CM

## 2014-01-18 DIAGNOSIS — I10 Essential (primary) hypertension: Secondary | ICD-10-CM

## 2014-01-18 LAB — COMPLETE METABOLIC PANEL WITH GFR
ALBUMIN: 4.3 g/dL (ref 3.5–5.2)
ALT: 21 U/L (ref 0–35)
AST: 17 U/L (ref 0–37)
Alkaline Phosphatase: 54 U/L (ref 39–117)
BILIRUBIN TOTAL: 1.1 mg/dL (ref 0.2–1.2)
BUN: 21 mg/dL (ref 6–23)
CO2: 28 mEq/L (ref 19–32)
Calcium: 9 mg/dL (ref 8.4–10.5)
Chloride: 103 mEq/L (ref 96–112)
Creat: 0.81 mg/dL (ref 0.50–1.10)
GFR, Est African American: >89 mL/min
GFR, Est Non African American: 85 mL/min
Glucose, Bld: 107 mg/dL — ABNORMAL HIGH (ref 70–99)
POTASSIUM: 4.8 meq/L (ref 3.5–5.3)
SODIUM: 139 meq/L (ref 135–145)
TOTAL PROTEIN: 6.5 g/dL (ref 6.0–8.3)

## 2014-01-18 LAB — LIPID PANEL
Cholesterol: 147 mg/dL (ref 0–200)
HDL: 41 mg/dL (ref >39)
LDL Cholesterol: 76 mg/dL (ref 0–99)
Total CHOL/HDL Ratio: 3.6 Ratio
Triglycerides: 150 mg/dL — ABNORMAL HIGH (ref <150)
VLDL: 30 mg/dL (ref 0–40)

## 2014-01-20 ENCOUNTER — Ambulatory Visit (INDEPENDENT_AMBULATORY_CARE_PROVIDER_SITE_OTHER): Payer: BC Managed Care – PPO | Admitting: Family Medicine

## 2014-01-20 ENCOUNTER — Encounter: Payer: Self-pay | Admitting: Family Medicine

## 2014-01-20 VITALS — BP 136/82 | HR 64 | Temp 98.4°F | Resp 16 | Ht 63.25 in | Wt 157.0 lb

## 2014-01-20 DIAGNOSIS — Z91048 Other nonmedicinal substance allergy status: Secondary | ICD-10-CM

## 2014-01-20 DIAGNOSIS — J45990 Exercise induced bronchospasm: Secondary | ICD-10-CM

## 2014-01-20 DIAGNOSIS — Z9109 Other allergy status, other than to drugs and biological substances: Secondary | ICD-10-CM

## 2014-01-20 DIAGNOSIS — I10 Essential (primary) hypertension: Secondary | ICD-10-CM

## 2014-01-20 MED ORDER — PRAVASTATIN SODIUM 20 MG PO TABS: ORAL_TABLET | ORAL | Status: DC

## 2014-01-20 MED ORDER — METOPROLOL TARTRATE 50 MG PO TABS: 50.0000 mg | ORAL_TABLET | Freq: Two times a day (BID) | ORAL | Status: DC

## 2014-01-20 MED ORDER — ALBUTEROL SULFATE HFA 108 (90 BASE) MCG/ACT IN AERS: 2.0000 | INHALATION_SPRAY | Freq: Four times a day (QID) | RESPIRATORY_TRACT | Status: DC | PRN

## 2014-01-20 MED ORDER — CITALOPRAM HYDROBROMIDE 20 MG PO TABS: ORAL_TABLET | ORAL | Status: DC

## 2014-01-20 MED ORDER — AZELASTINE HCL 0.1 % NA SOLN
2.0000 | Freq: Two times a day (BID) | NASAL | Status: DC
Start: 2014-01-20 — End: 2014-01-20

## 2014-01-20 MED ORDER — LOSARTAN POTASSIUM 50 MG PO TABS: 50.0000 mg | ORAL_TABLET | Freq: Every day | ORAL | Status: DC

## 2014-01-20 MED ORDER — FEXOFENADINE HCL 180 MG PO TABS: 180.0000 mg | ORAL_TABLET | Freq: Every day | ORAL | Status: DC

## 2014-01-20 MED ORDER — AZELASTINE HCL 0.1 % NA SOLN: 2.0000 | Freq: Two times a day (BID) | NASAL | Status: DC

## 2014-01-20 NOTE — Addendum Note (Signed)
Addended by: Legrand RamsWILLIS, Federick Levene B on: 01/20/2014 12:15 PM   Modules accepted: Orders

## 2014-01-20 NOTE — Progress Notes (Signed)
Subjective:    Patient ID: [name], female    DOB: 02/08/1966, 48 y.o.   MRN: [id number]  HPI Please see my last office visit. The patient continues to have problems with exercise-induced asthma. She is already had pulmonary function test when she is asymptomatic which showed no evidence of chronic obstructive pulmonary disease. She primarily notes wheezing and shortness of breath with exercise. She's had a normal stress test. She did not believe the Symbicort helped but she would like to try when necessary albuterol. She is currently on Allegra and Nasacort for her allergies. She continues to have breakthrough rhinorrhea and coughing and sneezing and sinus congestion even on these medications. She has tried Singulair without benefit. I reviewed her most recent lab work. Her LDL cholesterol is excellent at 70. Her fasting blood sugar was slightly elevated at 107. Past Medical History  Diagnosis Date  . Metabolic syndrome   . Hyperlipidemia   . Hypertension    No past surgical history on file. Current Outpatient Prescriptions on File Prior to Visit  Medication Sig Dispense Refill  . citalopram (CELEXA) 20 MG tablet TAKE 1 TABLET (20 MG TOTAL) BY MOUTH DAILY. 90 tablet 3  . fexofenadine (ALLEGRA) 180 MG tablet Take 180 mg by mouth daily.    Marland Kitchen. losartan (COZAAR) 50 MG tablet Take 1 tablet (50 mg total) by mouth daily. 90 tablet 3  . metoprolol (LOPRESSOR) 50 MG tablet Take 50 mg by mouth 2 (two) times daily.    . nitroGLYCERIN (NITROSTAT) 0.4 MG SL tablet Place 1 tablet (0.4 mg total) under the tongue every 5 (five) minutes as needed for chest pain. 30 tablet 12  . pravastatin (PRAVACHOL) 20 MG tablet TAKE 1 TABLET (20 MG TOTAL) BY MOUTH AT BEDTIME. NEED OFFICE VISIT AND LABS 30 tablet 0  . albuterol (PROVENTIL HFA;VENTOLIN HFA) 108 (90 BASE) MCG/ACT inhaler Inhale 2 puffs into the lungs every 6 (six) hours as needed for wheezing or shortness of breath. (Patient not taking: Reported on  01/20/2014) 1 Inhaler 3  . budesonide-formoterol (SYMBICORT) 160-4.5 MCG/ACT inhaler Inhale 2 puffs into the lungs 2 (two) times daily.    . montelukast (SINGULAIR) 10 MG tablet Take 1 tablet (10 mg total) by mouth at bedtime. (Patient not taking: Reported on 01/20/2014) 30 tablet 3   No current facility-administered medications on file prior to visit.   Allergies  Allergen Reactions  . Niacin And Related Other (See Comments)    Passes out   History   Social History  . Marital Status: Married    Spouse Name: N/A    Number of Children: N/A  . Years of Education: N/A   Occupational History  . Not on file.   Social History Main Topics  . Smoking status: Never Smoker   . Smokeless tobacco: Not on file  . Alcohol Use: No  . Drug Use: No  . Sexual Activity: Not on file   Other Topics Concern  . Not on file   Social History Narrative      Review of Systems  All other systems reviewed and are negative.      Objective:   Physical Exam  Cardiovascular: Normal rate, regular rhythm and normal heart sounds.   Pulmonary/Chest: Effort normal and breath sounds normal. No respiratory distress. She has no wheezes. She has no rales.  Abdominal: Soft. Bowel sounds are normal. She exhibits no distension. There is no tenderness. There is no rebound.  Vitals reviewed.  Assessment & Plan:  Environmental allergies - Plan: azelastine (ASTELIN) 0.1 % nasal spray  Asthma, exercise induced - Plan: albuterol (PROVENTIL HFA;VENTOLIN HFA) 108 (90 BASE) MCG/ACT inhaler  Discontinue Symbicort and Singulair. Try Astelin 2 sprays each nostril twice a day. Try albuterol 2 sprays inhaled when necessary as needed for exercise. Recheck in one month. Decrease the patient's pravastatin to 10 mg a day to see if we can improve her hyperglycemia

## 2014-02-21 ENCOUNTER — Telehealth: Payer: Self-pay | Admitting: Family Medicine

## 2014-02-21 MED ORDER — BUDESONIDE-FORMOTEROL FUMARATE 160-4.5 MCG/ACT IN AERO: 2.0000 | INHALATION_SPRAY | Freq: Two times a day (BID) | RESPIRATORY_TRACT | Status: DC

## 2014-02-21 NOTE — Telephone Encounter (Signed)
[phone] Pt states she believes she needs a daily maintaince medication for her breathing

## 2014-02-21 NOTE — Telephone Encounter (Signed)
Yes pt would like to go back on Symbicort - OK's per WTP - med sent to [facility]

## 2014-02-21 NOTE — Telephone Encounter (Signed)
Does she want to resume symbicort?

## 2014-03-01 ENCOUNTER — Telehealth: Payer: Self-pay | Admitting: Family Medicine

## 2014-03-01 DIAGNOSIS — J45990 Exercise induced bronchospasm: Secondary | ICD-10-CM

## 2014-03-01 NOTE — Telephone Encounter (Signed)
I am okay with referral to pulmonary

## 2014-03-01 NOTE — Telephone Encounter (Signed)
[phone] The inhalers that were given do not work and she would like to go to see a Pulmonary Dr

## 2014-03-01 NOTE — Telephone Encounter (Signed)
Referral placed to pulmonary

## 2014-03-03 ENCOUNTER — Ambulatory Visit
Admission: RE | Admit: 2014-03-03 | Discharge: 2014-03-03 | Disposition: A | Discharge status: Home / Self Care | Payer: BLUE CROSS/BLUE SHIELD | Source: Ambulatory Visit | Attending: Family Medicine | Admitting: Family Medicine

## 2014-03-03 ENCOUNTER — Encounter: Payer: Self-pay | Admitting: Family Medicine

## 2014-03-03 ENCOUNTER — Ambulatory Visit (INDEPENDENT_AMBULATORY_CARE_PROVIDER_SITE_OTHER): Payer: BLUE CROSS/BLUE SHIELD | Admitting: Family Medicine

## 2014-03-03 VITALS — BP 128/74 | HR 76 | Temp 97.8°F | Resp 18 | Ht 63.25 in | Wt 158.0 lb

## 2014-03-03 DIAGNOSIS — R0609 Other forms of dyspnea: Secondary | ICD-10-CM

## 2014-03-03 MED ORDER — FLUTICASONE PROPIONATE 50 MCG/ACT NA SUSP: 2.0000 | Freq: Every day | NASAL | Status: DC

## 2014-03-03 NOTE — Progress Notes (Signed)
Subjective:    Patient ID: [name], female    DOB: 11-28-1965, 49 y.o.   MRN: [id number]  HPI  Patient was admitted to hospital for dyspnea on exertion and had a ETT which was relatively nml except nonspecific ST changes.  It was felt that she may have exercise-induced asthma. We started the patient on Symbicort last year with benefit. Patient discontinue Symbicort recently. The dyspnea on exertion has dramatically worsened. She now reports trouble breathing with minimal exercise. We have resumed Symbicort for 1 week but the patient has not experienced any improvement. She has not tried albuterol when she feels short of breath. She does have chronic runny nose for which she takes Allegra. She denies any acid reflux. She is on a beta blocker for hypertension which may be contributing. She has not had an echocardiogram in more than 5 years. Pulmonary function tests today show an FEV1 to FVC ratio of 69% with an FEV1 percentage of approximately 86% qualifying for stage I chronic bronchitis. Past Medical History  Diagnosis Date  . Metabolic syndrome   . Hyperlipidemia   . Hypertension    No past surgical history on file. Current Outpatient Prescriptions on File Prior to Visit  Medication Sig Dispense Refill  . albuterol (PROVENTIL HFA;VENTOLIN HFA) 108 (90 BASE) MCG/ACT inhaler Inhale 2 puffs into the lungs every 6 (six) hours as needed for wheezing or shortness of breath. (Patient not taking: Reported on 01/20/2014) 1 Inhaler 3  . azelastine (ASTELIN) 0.1 % nasal spray Place 2 sprays into both nostrils 2 (two) times daily. Use in each nostril as directed 30 mL 12  . budesonide-formoterol (SYMBICORT) 160-4.5 MCG/ACT inhaler Inhale 2 puffs into the lungs 2 (two) times daily. 1 Inhaler 3  . citalopram (CELEXA) 20 MG tablet TAKE 1 TABLET (20 MG TOTAL) BY MOUTH DAILY. 90 tablet 3  . fexofenadine (ALLEGRA) 180 MG tablet Take 1 tablet (180 mg total) by mouth daily. 30 tablet 11  . losartan  (COZAAR) 50 MG tablet Take 1 tablet (50 mg total) by mouth daily. 90 tablet 3  . metoprolol (LOPRESSOR) 50 MG tablet Take 1 tablet (50 mg total) by mouth 2 (two) times daily. 60 tablet 11  . nitroGLYCERIN (NITROSTAT) 0.4 MG SL tablet Place 1 tablet (0.4 mg total) under the tongue every 5 (five) minutes as needed for chest pain. 30 tablet 12  . pravastatin (PRAVACHOL) 20 MG tablet TAKE 1 TABLET (20 MG TOTAL) BY MOUTH AT BEDTIME. 30 tablet 5   No current facility-administered medications on file prior to visit.   Allergies  Allergen Reactions  . Niacin And Related Other (See Comments)    Passes out   History   Social History  . Marital Status: Married    Spouse Name: N/A    Number of Children: N/A  . Years of Education: N/A   Occupational History  . Not on file.   Social History Main Topics  . Smoking status: Never Smoker   . Smokeless tobacco: Not on file  . Alcohol Use: No  . Drug Use: No  . Sexual Activity: Not on file   Other Topics Concern  . Not on file   Social History Narrative     Review of Systems  All other systems reviewed and are negative.      Objective:   Physical Exam  Constitutional: She appears well-developed and well-nourished.  Neck: Neck supple. No JVD present. No thyromegaly present.  Cardiovascular: Normal rate, regular rhythm, normal  heart sounds and intact distal pulses.   No murmur heard. Pulmonary/Chest: Effort normal and breath sounds normal. No respiratory distress. She has no wheezes. She has no rales.  Abdominal: Soft. Bowel sounds are normal. She exhibits no distension. There is no tenderness. There is no rebound and no guarding.  Musculoskeletal: She exhibits no edema.  Lymphadenopathy:    She has no cervical adenopathy.  Vitals reviewed.  Initial PFTs reveal an FEV1 to FVC ratio of 69% with an FEV1 volume of 2.18 L which is 85% of predicted all fine for stage I COPD. Patient received a 2.5 mg albuterol neb and her pulmonary  function test did not improve however symptomatically she did improve.       Assessment & Plan:  DOE (dyspnea on exertion) - Plan: DG Chest 2 View, fluticasone (FLONASE) 50 MCG/ACT nasal spray  I will obtain a chest x-ray to rule out lung pathology. Currently the patient has stage I COPD possibly an exercise-induced asthma. Discontinue Symbicort and [facility] the [facility], 2 puffs inhaled twice a day. Add Flonase to reduce the chance of postnasal drip as a trigger. Continue Allegra and Astelin. I will also schedule the patient see a pulmonologist. At the present time the patient refuses PPI she does not feel that she is having any acid reflux. I also asked the patient to premedicate herself with albuterol prior to any exercise. If symptoms persist I recommend a repeat echocardiogram

## 2014-03-07 ENCOUNTER — Telehealth: Payer: Self-pay | Admitting: Family Medicine

## 2014-03-07 DIAGNOSIS — I517 Cardiomegaly: Secondary | ICD-10-CM

## 2014-03-07 NOTE — Telephone Encounter (Signed)
Patient is calling for chest xray results  [phone]

## 2014-03-07 NOTE — Telephone Encounter (Signed)
Patient aware of results and echo ordered

## 2014-03-08 ENCOUNTER — Telehealth: Payer: Self-pay | Admitting: *Deleted

## 2014-03-08 NOTE — Telephone Encounter (Signed)
Submitted referral thru [facility] for pt to have Echocardiogram with authorization number [id number], [facility] Jan [facility] [address] [place] Valid thru 03/08/14-04/06/2014. Faxed authorization to [facility]

## 2014-03-10 ENCOUNTER — Other Ambulatory Visit (HOSPITAL_COMMUNITY): Payer: Self-pay | Admitting: Family Medicine

## 2014-03-10 DIAGNOSIS — I517 Cardiomegaly: Secondary | ICD-10-CM

## 2014-03-14 ENCOUNTER — Encounter: Payer: Self-pay | Admitting: Pulmonary Disease

## 2014-03-14 ENCOUNTER — Ambulatory Visit (INDEPENDENT_AMBULATORY_CARE_PROVIDER_SITE_OTHER): Payer: BLUE CROSS/BLUE SHIELD | Admitting: Pulmonary Disease

## 2014-03-14 ENCOUNTER — Ambulatory Visit (HOSPITAL_COMMUNITY): Payer: BLUE CROSS/BLUE SHIELD | Attending: Cardiology

## 2014-03-14 VITALS — BP 144/80 | HR 61 | Temp 97.0°F | Ht 63.0 in | Wt 160.6 lb

## 2014-03-14 DIAGNOSIS — R05 Cough: Secondary | ICD-10-CM

## 2014-03-14 DIAGNOSIS — I517 Cardiomegaly: Secondary | ICD-10-CM | POA: Diagnosis present

## 2014-03-14 DIAGNOSIS — R0609 Other forms of dyspnea: Secondary | ICD-10-CM

## 2014-03-14 DIAGNOSIS — R053 Chronic cough: Secondary | ICD-10-CM

## 2014-03-14 MED ORDER — OMEPRAZOLE 40 MG PO CPDR: 40.0000 mg | DELAYED_RELEASE_CAPSULE | Freq: Two times a day (BID) | ORAL | Status: DC

## 2014-03-14 NOTE — Assessment & Plan Note (Signed)
The patient has a history of a chronic cough that actually goes back about one to 2 years. She has noticed significant worsening over the last 2 weeks, and from her description this sounds more upper airway in origin than lower. She has a hollow and raspy sound to her voice, has hoarseness, describes a classic globus sensation, and has frequent throat clearing during our visit. It is unclear how much of this is related to postnasal drip, but she also has a history for reflux. She has significant dyspnea on exertion that is not always explained by an upper airway issue, although it can be associated with upper airway edema and vocal cord dysfunction. The question has been raised whether she may have airflow obstruction on recent spirometry, but I do not have the data available for review of her curves and tracings to see if it was an adequate study.  She really has not seen a big change in her breathing either with Symbicort in the past or with dulera currently. I would like for her to discontinue her bronchodilator therapy, and focus on eliminating irritants to the upper airway. This will include more aggressive treatment of postnasal drip and reflux, and I have also reviewed with her the behavioral therapies for cyclical coughing. I have stressed to her the importance of cessation of throat clearing. I will see her back in 3 weeks, and we can do spirometry to reevaluate.

## 2014-03-14 NOTE — Progress Notes (Signed)
   Subjective:    Patient ID: [name], female    DOB: 07/14/1965, 49 y.o.   MRN: [id number]  HPI The patient is a 49 year old female who I've been asked to see for persistent cough and dyspnea on exertion. She states that she has had a cough for about one to 2 years, but has had significant worsening over the last 2 weeks and associated with worsening dyspnea. The patient has been on Symbicort in the past for her cough without change, and most recently was put on dulera. For the last 2 weeks she has had significant dyspnea on exertion, which occurs with bringing groceries in from the car, walking up a flight of stairs, or trying to do any kind of exercise. She has had a chest x-ray that showed cardiomegaly but no acute process. She has been scheduled for an echocardiogram. She had spirometry in March of last year which was normal, but the curves showed inadequate exhalation time for validity.  She apparently has had recent spirometry that may have indicated airflow obstruction, but the data is not available for my review in the system. The patient notes frequent throat clearing, hoarseness, and a raspiness in her voice. He also describes classic globus sensation with throat fullness. She has minimal mucus production with her cough. She does have significant postnasal drip despite taking Allegra and a topical nasal steroid. She denies having prior issues with reflux, but the last one week has had increasing symptoms. She denies any history of childhood asthma, and has never smoked.   Review of Systems  Constitutional: Negative for fever and unexpected weight change.  HENT: Positive for congestion and sore throat. Negative for dental problem, ear pain, nosebleeds, postnasal drip, rhinorrhea, sinus pressure, sneezing and trouble swallowing.   Eyes: Negative for redness and itching.  Respiratory: Positive for cough, chest tightness, shortness of breath and wheezing.   Cardiovascular: Negative for  palpitations and leg swelling.  Gastrointestinal: Negative for nausea and vomiting.  Genitourinary: Negative for dysuria.  Musculoskeletal: Negative for joint swelling.  Skin: Negative for rash.  Neurological: Positive for headaches.  Hematological: Does not bruise/bleed easily.  Psychiatric/Behavioral: Negative for dysphoric mood. The patient is not nervous/anxious.        Objective:   Physical Exam Constitutional:  Overweight female, no acute distress  HENT:  Nares patent without discharge, but narrowed bilat.  Oropharynx without exudate, palate and uvula are normal  Eyes:  Perrla, eomi, no scleral icterus  Neck:  No JVD, no TMG  Cardiovascular:  Normal rate, regular rhythm, no rubs or gallops.  No murmurs        Intact distal pulses  Pulmonary :  Normal breath sounds, no stridor or respiratory distress   No rales, rhonchi, or wheezing  Abdominal:  Soft, nondistended, bowel sounds present.  No tenderness noted.   Musculoskeletal:  No lower extremity edema noted.  Lymph Nodes:  No cervical lymphadenopathy noted  Skin:  No cyanosis noted  Neurologic:  Alert, appropriate, moves all 4 extremities without obvious deficit.         Assessment & Plan:

## 2014-03-14 NOTE — Progress Notes (Signed)
2D Echo completed. 03/14/2014 

## 2014-03-14 NOTE — Patient Instructions (Signed)
Stop dulera, but can use albuterol if needed for emergencies Take omeprazole 40mg  in am and pm everyday until next visit. Minimize voice use. Use hard candy (no menthol or mint) to minimize fullness in throat and throat clearing.  Take chlorpheniramine 4mg  OTC, 1-2 at bedtime.  Can still take allegra during the day followup with me again in 3 weeks, but call if you feel your symptoms are worsening.

## 2014-03-16 ENCOUNTER — Telehealth: Payer: Self-pay | Admitting: Family Medicine

## 2014-03-16 NOTE — Telephone Encounter (Signed)
Pt made aware of results.

## 2014-03-16 NOTE — Telephone Encounter (Signed)
Patient is calling to get results of echocardiogram  [phone]

## 2014-03-16 NOTE — Telephone Encounter (Signed)
-----   Message from Donita BrooksWarren T Pickard, MD sent at 03/15/2014  7:03 AM EST ----- No evidence of chf as a cause for her sob with exertion.

## 2014-04-01 ENCOUNTER — Ambulatory Visit (INDEPENDENT_AMBULATORY_CARE_PROVIDER_SITE_OTHER): Payer: BLUE CROSS/BLUE SHIELD | Admitting: Family Medicine

## 2014-04-01 ENCOUNTER — Encounter: Payer: Self-pay | Admitting: Family Medicine

## 2014-04-01 VITALS — BP 128/64 | HR 68 | Temp 98.8°F | Resp 16 | Ht 63.0 in | Wt 163.0 lb

## 2014-04-01 DIAGNOSIS — R300 Dysuria: Secondary | ICD-10-CM

## 2014-04-01 DIAGNOSIS — N3001 Acute cystitis with hematuria: Secondary | ICD-10-CM

## 2014-04-01 LAB — URINALYSIS, ROUTINE W REFLEX MICROSCOPIC
Bilirubin Urine: NEGATIVE
GLUCOSE, UA: NEGATIVE mg/dL
Ketones, ur: NEGATIVE mg/dL
Nitrite: NEGATIVE
PH: 7 (ref 5.0–8.0)
Protein, ur: NEGATIVE mg/dL
SPECIFIC GRAVITY, URINE: 1.015 (ref 1.005–1.030)
Urobilinogen, UA: 0.2 mg/dL (ref 0.0–1.0)

## 2014-04-01 LAB — URINALYSIS, MICROSCOPIC ONLY
Casts: NONE SEEN
Crystals: NONE SEEN

## 2014-04-01 MED ORDER — PHENAZOPYRIDINE HCL 100 MG PO TABS: 100.0000 mg | ORAL_TABLET | Freq: Three times a day (TID) | ORAL | Status: DC | PRN

## 2014-04-01 MED ORDER — CIPROFLOXACIN HCL 500 MG PO TABS: 500.0000 mg | ORAL_TABLET | Freq: Two times a day (BID) | ORAL | Status: DC

## 2014-04-01 NOTE — Patient Instructions (Signed)
Take antibiotics, plenty of fluid Cranberry juice F/U as needed

## 2014-04-01 NOTE — Progress Notes (Signed)
Patient ID: Crystal Coeammy C Gruetzmacher, female   DOB: 05-21-1965, 49 y.o.   MRN: [id number]   Subjective:    Patient ID: [name], female    DOB: 05-21-1965, 49 y.o.   MRN: [id number]  Patient presents for Hematuria  Pt here with dysuria, frequency, pelvic pressure, lower back pain , chills x 3 days. Menopausal. No change in bowels. No history of recurrent UTI.  She noticed some blood in urine today.  [place] of the [place]     Review Of Systems:  GEN- denies fatigue, fever, weight loss,weakness, recent illness HEENT- denies eye drainage, change in vision, nasal discharge, CVS- denies chest pain, palpitations RESP- denies SOB, cough, wheeze ABD- denies N/V, change in stools, abd pain GU- +dysuria, +hematuria, dribbling, incontinence MSK- denies joint pain, muscle aches, injury Neuro- denies headache, dizziness, syncope, seizure activity       Objective:    BP 128/64 mmHg  Pulse 68  Temp(Src) 98.8 F (37.1 C) (Oral)  Resp 16  Ht 5\' 3"  (1.6 m)  Wt 163 lb (73.936 kg)  BMI 28.88 kg/m2 GEN- NAD, alert and oriented x3, non toxic appearing ABD-NABS,soft,ND, + suprapubic tenderness, mild LLQ quandrant tenderness, no rebound, no guarding, no CVA tenderness Pulses- Radial 2+        Assessment & Plan:      Problem List Items Addressed This Visit    None    Visit Diagnoses    Dysuria    -  Primary    Relevant Orders    Urinalysis, Routine w reflex microscopic    Acute cystitis with hematuria        Treat with cipro x 7 days, given pyridium, plenty of water, cranberry, she will call if symptoms persist or any yeast infection symptoms       Note: This dictation was prepared with Dragon dictation along with smaller phrase technology. Any transcriptional errors that result from this process are unintentional.

## 2014-04-04 ENCOUNTER — Ambulatory Visit: Payer: BLUE CROSS/BLUE SHIELD | Admitting: Pulmonary Disease

## 2014-04-04 ENCOUNTER — Telehealth: Payer: Self-pay | Admitting: Pulmonary Disease

## 2014-04-04 MED ORDER — OMEPRAZOLE 40 MG PO CPDR: 40.0000 mg | DELAYED_RELEASE_CAPSULE | Freq: Two times a day (BID) | ORAL | Status: DC

## 2014-04-04 NOTE — Telephone Encounter (Signed)
Pt states she had OV today at 9:15am but office did not open until 10:00am due to weather. Pt has RSC'd to 04-18-14 with [facility] but needs rx for Omeprazole until seen. Pt is aware that I have sent Rx to her drug store and aware to keep OV 04-18-14. Nothing more needed at this time.

## 2014-04-18 ENCOUNTER — Encounter: Payer: Self-pay | Admitting: Pulmonary Disease

## 2014-04-18 ENCOUNTER — Ambulatory Visit (INDEPENDENT_AMBULATORY_CARE_PROVIDER_SITE_OTHER): Payer: BLUE CROSS/BLUE SHIELD | Admitting: Pulmonary Disease

## 2014-04-18 VITALS — BP 128/76 | HR 69 | Temp 97.8°F | Ht 63.0 in | Wt 162.0 lb

## 2014-04-18 DIAGNOSIS — R053 Chronic cough: Secondary | ICD-10-CM

## 2014-04-18 DIAGNOSIS — R0609 Other forms of dyspnea: Secondary | ICD-10-CM

## 2014-04-18 DIAGNOSIS — R05 Cough: Secondary | ICD-10-CM

## 2014-04-18 MED ORDER — PREDNISONE 10 MG PO TABS: ORAL_TABLET | ORAL | Status: DC

## 2014-04-18 NOTE — Assessment & Plan Note (Signed)
The patient felt that her cough was doing better on the reflux medication, but then developed an episode of bronchitis while traveling out of the country. She has persistent chest congestion although she is no longer bringing up purulent mucus after being treated with an antibiotic. Unfortunately, this is making all of this difficult to sort out, and we'll therefore treat her with a short course of prednisone and mucolytic to see if we can get her back to baseline. Once this is done, we can then better assess her for the presence of airways disease with a methacholine challenge testing.

## 2014-04-18 NOTE — Progress Notes (Signed)
   Subjective:    Patient ID: [name], female    DOB: 1965/08/13, 49 y.o.   MRN: [id number]  HPI The patient comes in today for follow-up of her known cough. At the last visit, this was felt to be upper airway in origin, and she was taken off her maintenance bronchodilator regimen. She was also treated for acid reflux, and feels this has definitely helped her symptoms, especially the throat clearing. She recently vacationed at [place], and developed chest congestion with cough of purulent mucus. She was treated with a Z-Pak at that time, but feels that her congestion has been slow to resolve. She continues to have dyspnea on exertion, as well as congestion. She has not had any fevers, chills, or sweats.   Review of Systems  Constitutional: Negative for fever and unexpected weight change.  HENT: Positive for congestion and postnasal drip. Negative for dental problem, ear pain, nosebleeds, rhinorrhea, sinus pressure, sneezing, sore throat and trouble swallowing.   Eyes: Negative for redness and itching.  Respiratory: Positive for cough and shortness of breath. Negative for chest tightness and wheezing.   Cardiovascular: Negative for palpitations and leg swelling.  Gastrointestinal: Negative for nausea and vomiting.  Genitourinary: Negative for dysuria.  Musculoskeletal: Negative for joint swelling.  Skin: Negative for rash.  Neurological: Negative for headaches.  Hematological: Does not bruise/bleed easily.  Psychiatric/Behavioral: Negative for dysphoric mood. The patient is not nervous/anxious.        Objective:   Physical Exam Well-developed female in no acute distress Nose without purulence or discharge noted Neck without lymphadenopathy or thyromegaly Chest with fairly clear breath sounds, no active wheezing Cardiac exam with regular rate and rhythm Lower extremities without edema, no cyanosis Alert and oriented, moves all 4 extremities.       Assessment & Plan:

## 2014-04-18 NOTE — Assessment & Plan Note (Signed)
The patient does not have airflow obstruction by her spirometry today, and her flow volume loop does not suggest this either. She continues to have a raspiness to her upper airway with inspiration and expiration, and she may need to have an upper airway evaluation by ENT at some point. Again, we will get her scheduled for a methacholine challenge once she gets back to her baseline.

## 2014-04-18 NOTE — Patient Instructions (Signed)
Take mucinex dm one am and pm until better. Will treat with a short course of prednisone to get you thru this episode Once your are much better, please call me so we can schedule for methacholine challenge test to better evaluate your shortness of breath.

## 2014-06-06 ENCOUNTER — Telehealth: Payer: Self-pay | Admitting: Pulmonary Disease

## 2014-06-06 DIAGNOSIS — R05 Cough: Secondary | ICD-10-CM

## 2014-06-06 DIAGNOSIS — R053 Chronic cough: Secondary | ICD-10-CM

## 2014-06-06 NOTE — Telephone Encounter (Signed)
Per KC's OV instructions, pt was to call once she was feeling better to be scheduled for a methacholine challenge. Order will be place. Pt is aware. Nothing further was needed.

## 2014-06-08 ENCOUNTER — Ambulatory Visit (HOSPITAL_COMMUNITY)
Admission: RE | Admit: 2014-06-08 | Discharge: 2014-06-08 | Disposition: A | Discharge status: Home / Self Care | Payer: BLUE CROSS/BLUE SHIELD | Source: Ambulatory Visit | Attending: Pulmonary Disease | Admitting: Pulmonary Disease

## 2014-06-08 DIAGNOSIS — R05 Cough: Secondary | ICD-10-CM | POA: Insufficient documentation

## 2014-06-08 DIAGNOSIS — R053 Chronic cough: Secondary | ICD-10-CM

## 2014-06-08 LAB — PULMONARY FUNCTION TEST
FEF 25-75 POST: 1.51 L/s
FEF 25-75 PRE: 1.98 L/s
FEF2575-%Change-Post: -23 %
FEF2575-%Pred-Post: 55 %
FEF2575-%Pred-Pre: 72 %
FEV1-%Change-Post: -15 %
FEV1-%Pred-Post: 66 %
FEV1-%Pred-Pre: 78 %
FEV1-Post: 1.85 L
FEV1-Pre: 2.18 L
FEV1FVC-%Change-Post: -12 %
FEV1FVC-%Pred-Pre: 82 %
FEV6-%CHANGE-POST: -5 %
FEV6-%PRED-POST: 89 %
FEV6-%Pred-Pre: 95 %
FEV6-Post: 3.08 L
FEV6-Pre: 3.27 L
FEV6FVC-%CHANGE-POST: 0 %
FEV6FVC-%Pred-Post: 102 %
FEV6FVC-%Pred-Pre: 102 %
FVC-%Change-Post: -2 %
FVC-%PRED-POST: 91 %
FVC-%Pred-Pre: 93 %
FVC-POST: 3.22 L
FVC-Pre: 3.3 L
POST FEV6/FVC RATIO: 99 %
PRE FEV1/FVC RATIO: 66 %
Post FEV1/FVC ratio: 58 %
Pre FEV6/FVC Ratio: 99 %

## 2014-06-08 MED ORDER — METHACHOLINE 4 MG/ML NEB SOLN
2.0000 mL | Freq: Once | RESPIRATORY_TRACT | Status: AC
  Administered 2014-06-08: 8 mg via RESPIRATORY_TRACT

## 2014-06-08 MED ORDER — METHACHOLINE 0.25 MG/ML NEB SOLN
2.0000 mL | Freq: Once | RESPIRATORY_TRACT | Status: AC
  Administered 2014-06-08: 0.5 mg via RESPIRATORY_TRACT

## 2014-06-08 MED ORDER — METHACHOLINE 0.0625 MG/ML NEB SOLN
2.0000 mL | Freq: Once | RESPIRATORY_TRACT | Status: AC
  Administered 2014-06-08: 0.125 mg via RESPIRATORY_TRACT

## 2014-06-08 MED ORDER — METHACHOLINE 1 MG/ML NEB SOLN
2.0000 mL | Freq: Once | RESPIRATORY_TRACT | Status: AC
  Administered 2014-06-08: 2 mg via RESPIRATORY_TRACT

## 2014-06-08 MED ORDER — SODIUM CHLORIDE 0.9 % IN NEBU
3.0000 mL | INHALATION_SOLUTION | Freq: Once | RESPIRATORY_TRACT | Status: AC
  Administered 2014-06-08: 3 mL via RESPIRATORY_TRACT

## 2014-06-08 MED ORDER — ALBUTEROL SULFATE (2.5 MG/3ML) 0.083% IN NEBU
2.5000 mg | INHALATION_SOLUTION | Freq: Once | RESPIRATORY_TRACT | Status: AC
  Administered 2014-06-08: 2.5 mg via RESPIRATORY_TRACT

## 2014-06-08 MED ORDER — METHACHOLINE 16 MG/ML NEB SOLN
2.0000 mL | Freq: Once | RESPIRATORY_TRACT | Status: AC
  Administered 2014-06-08: 32 mg via RESPIRATORY_TRACT

## 2014-06-09 ENCOUNTER — Telehealth: Payer: Self-pay | Admitting: Pulmonary Disease

## 2014-06-09 NOTE — Telephone Encounter (Signed)
Patient requesting results of PFT test that was done at the hospital yesterday.  Results are in epic. CY - please advise.  Thanks.

## 2014-06-09 NOTE — Telephone Encounter (Signed)
Patient requesting PFT results that were done at hospital.  Results are in epic.  KC - please advise, thanks.

## 2014-06-09 NOTE — Telephone Encounter (Signed)
Dr Shelle Ironlance ordered this and should be the one to discuss results with her. Please ask her to call him tomorrow.

## 2014-06-10 NOTE — Telephone Encounter (Signed)
Called pt and appt scheduled for Monday with [facility]

## 2014-06-10 NOTE — Telephone Encounter (Signed)
Let pt know that her test results are complicated, and I really need to discuss with her face to face. Please see if we can get her in early next week.

## 2014-06-13 ENCOUNTER — Ambulatory Visit (INDEPENDENT_AMBULATORY_CARE_PROVIDER_SITE_OTHER): Payer: BLUE CROSS/BLUE SHIELD | Admitting: Pulmonary Disease

## 2014-06-13 ENCOUNTER — Encounter: Payer: Self-pay | Admitting: Pulmonary Disease

## 2014-06-13 VITALS — BP 132/78 | HR 71 | Temp 97.8°F | Ht 64.0 in | Wt 162.2 lb

## 2014-06-13 DIAGNOSIS — J383 Other diseases of vocal cords: Secondary | ICD-10-CM | POA: Diagnosis not present

## 2014-06-13 DIAGNOSIS — R0609 Other forms of dyspnea: Secondary | ICD-10-CM | POA: Diagnosis not present

## 2014-06-13 NOTE — Assessment & Plan Note (Signed)
Even though the patient has minimal airflow limitation by FEV1 percent on her baseline spirometry at the time of her methacholine challenge, there is really nothing to indicate true asthma. She has been on very aggressive asthma medications with no change in her symptoms. She has never had airflow limitation on any of her other spirometry.  Again, I suspect her issue is upper airway in origin.

## 2014-06-13 NOTE — Patient Instructions (Signed)
Will refer to Dr Delford FieldWright at [facility] for evaluation. Continue on omeprazole 40mg  am and pm for now.  Please call me once your evaluation is done so I can track down results.

## 2014-06-13 NOTE — Progress Notes (Signed)
   Subjective:    Patient ID: [name], female    DOB: Aug 23, 1965, 49 y.o.   MRN: [id number]  HPI The patient comes in today for follow-up of her recent methacholine challenge. She was found to have minimal airflow limitation on her baseline spirometry, however her flow volume loop showed significant flattening of her expiratory limb and truncation of her inspiratory limb.  She had no evidence for airway hyperreactivity to methacholine. I have reviewed the study with her in detail, and answered all of her questions. The patient agrees this is very unlikely to be asthma after thinking about all we have discussed.   Review of Systems  Constitutional: Negative for fever and unexpected weight change.  HENT: Positive for congestion and postnasal drip. Negative for dental problem, ear pain, nosebleeds, rhinorrhea, sinus pressure, sneezing, sore throat and trouble swallowing.   Eyes: Negative for redness and itching.  Respiratory: Positive for cough, shortness of breath and wheezing. Negative for chest tightness.   Cardiovascular: Negative for palpitations and leg swelling.  Gastrointestinal: Negative for nausea and vomiting.  Genitourinary: Negative for dysuria.  Musculoskeletal: Negative for joint swelling.  Skin: Negative for rash.  Neurological: Negative for headaches.  Hematological: Does not bruise/bleed easily.  Psychiatric/Behavioral: Negative for dysphoric mood. The patient is not nervous/anxious.        Objective:   Physical Exam Well-developed female in no acute distress Nose without purulence or discharge noted Lower extremities without edema, no cyanosis Alert and oriented, moves all 4 extremities.       Assessment & Plan:

## 2014-06-13 NOTE — Assessment & Plan Note (Signed)
The patient has a negative methacholine challenge test, but has significant truncation/flattening of her inspiratory and expiratory limbs of her flow volume loop. I believe that all of her symptoms are upper airway in origin, and given the findings, she will need a complete upper airway exam to look for anatomical obstruction. I suspect this is vocal cord dysfunction, and would like to continue her on treatment for reflux and postnasal drip. Will refer her to the [facility] for her upper airway exam and evaluation. She is agreeable to this approach.

## 2014-06-15 DIAGNOSIS — J386 Stenosis of larynx: Secondary | ICD-10-CM | POA: Insufficient documentation

## 2014-06-27 ENCOUNTER — Telehealth: Payer: Self-pay | Admitting: *Deleted

## 2014-06-27 NOTE — Telephone Encounter (Signed)
Records from [facility] from recent surgery given to [facility] to review

## 2014-08-04 ENCOUNTER — Encounter: Payer: Self-pay | Admitting: Family Medicine

## 2014-09-26 ENCOUNTER — Other Ambulatory Visit: Payer: Self-pay | Admitting: Family Medicine

## 2014-09-26 NOTE — Telephone Encounter (Signed)
Refill appropriate and filled per protocol. 

## 2014-10-17 ENCOUNTER — Ambulatory Visit (INDEPENDENT_AMBULATORY_CARE_PROVIDER_SITE_OTHER): Payer: BLUE CROSS/BLUE SHIELD | Admitting: Family Medicine

## 2014-10-17 ENCOUNTER — Encounter: Payer: Self-pay | Admitting: Family Medicine

## 2014-10-17 VITALS — BP 100/58 | HR 60 | Temp 98.1°F | Resp 14 | Ht 63.25 in | Wt 168.0 lb

## 2014-10-17 DIAGNOSIS — K529 Noninfective gastroenteritis and colitis, unspecified: Secondary | ICD-10-CM

## 2014-10-17 LAB — CBC WITH DIFFERENTIAL/PLATELET
BASOS ABS: 0.1 10*3/uL (ref 0.0–0.1)
Basophils Relative: 1 % (ref 0–1)
Eosinophils Absolute: 0.1 10*3/uL (ref 0.0–0.7)
Eosinophils Relative: 1 % (ref 0–5)
HEMATOCRIT: 39.4 % (ref 36.0–46.0)
HEMOGLOBIN: 13.4 g/dL (ref 12.0–15.0)
LYMPHS PCT: 42 % (ref 12–46)
Lymphs Abs: 2.7 10*3/uL (ref 0.7–4.0)
MCH: 31.8 pg (ref 26.0–34.0)
MCHC: 34 g/dL (ref 30.0–36.0)
MCV: 93.4 fL (ref 78.0–100.0)
MPV: 9.6 fL (ref 8.6–12.4)
Monocytes Absolute: 0.6 10*3/uL (ref 0.1–1.0)
Monocytes Relative: 9 % (ref 3–12)
NEUTROS ABS: 3.1 10*3/uL (ref 1.7–7.7)
Neutrophils Relative %: 47 % (ref 43–77)
Platelets: 322 10*3/uL (ref 150–400)
RBC: 4.22 MIL/uL (ref 3.87–5.11)
RDW: 13.3 % (ref 11.5–15.5)
WBC: 6.5 10*3/uL (ref 4.0–10.5)

## 2014-10-17 MED ORDER — METRONIDAZOLE 500 MG PO TABS: 500.0000 mg | ORAL_TABLET | Freq: Two times a day (BID) | ORAL | Status: DC

## 2014-10-17 MED ORDER — CIPROFLOXACIN HCL 500 MG PO TABS: 500.0000 mg | ORAL_TABLET | Freq: Two times a day (BID) | ORAL | Status: DC

## 2014-10-17 NOTE — Progress Notes (Signed)
Subjective:    Patient ID: [name], female    DOB: June 11, 1965, 49 y.o.   MRN: [id number]  HPI  patient presents today with several days of worsening left lower quadrant abdominal pain she is tender to palpation left lower quadrant. She has noticed some black tarry stools as well as some blood-tinged stools. She had a colonoscopy within the last year which revealed no cancerous polyps. She denies any fevers chills or weight loss. She denies any nausea or vomiting. She denies any constipation. Past Medical History  Diagnosis Date  . Metabolic syndrome   . Hyperlipidemia   . Hypertension   . Tracheal stenosis    Past Surgical History  Procedure Laterality Date  . Tubal ligation      1992  . Vaginal hysterectomy      partial 1995  . Tonsillectomy      age 12  . Adenoidectomy      age 65  . Cleft palate repair      infant   Current Outpatient Prescriptions on File Prior to Visit  Medication Sig Dispense Refill  . albuterol (PROVENTIL HFA;VENTOLIN HFA) 108 (90 BASE) MCG/ACT inhaler Inhale 2 puffs into the lungs every 6 (six) hours as needed for wheezing or shortness of breath. 1 Inhaler 3  . citalopram (CELEXA) 20 MG tablet TAKE 1 TABLET (20 MG TOTAL) BY MOUTH DAILY. 30 tablet 2  . fexofenadine (ALLEGRA) 180 MG tablet Take 1 tablet (180 mg total) by mouth daily. 30 tablet 11  . fluticasone (FLONASE) 50 MCG/ACT nasal spray Place 2 sprays into both nostrils daily. (Patient taking differently: Place 2 sprays into both nostrils 2 (two) times daily. ) 16 g 6  . losartan (COZAAR) 50 MG tablet Take 1 tablet (50 mg total) by mouth daily. 90 tablet 3  . metoprolol (LOPRESSOR) 50 MG tablet Take 1 tablet (50 mg total) by mouth 2 (two) times daily. 60 tablet 11  . nitroGLYCERIN (NITROSTAT) 0.4 MG SL tablet Place 1 tablet (0.4 mg total) under the tongue every 5 (five) minutes as needed for chest pain. 30 tablet 12  . omeprazole (PRILOSEC) 40 MG capsule Take 1 capsule (40 mg total) by mouth 2  (two) times daily. (Patient taking differently: Take 40 mg by mouth daily. ) 60 capsule 0  . pravastatin (PRAVACHOL) 20 MG tablet TAKE 1 TABLET (20 MG TOTAL) BY MOUTH AT BEDTIME. 30 tablet 5   No current facility-administered medications on file prior to visit.   Allergies  Allergen Reactions  . Niacin And Related Other (See Comments)    Passes out   Social History   Social History  . Marital Status: Married    Spouse Name: N/A  . Number of Children: 2  . Years of Education: N/A   Occupational History  . office manager    Social History Main Topics  . Smoking status: Never Smoker   . Smokeless tobacco: Never Used  . Alcohol Use: No  . Drug Use: No  . Sexual Activity: Yes   Other Topics Concern  . Not on file   Social History Narrative     Review of Systems  All other systems reviewed and are negative.      Objective:   Physical Exam  Cardiovascular: Normal rate, regular rhythm and normal heart sounds.   No murmur heard. Pulmonary/Chest: Effort normal and breath sounds normal. No respiratory distress. She has no wheezes. She has no rales. She exhibits no tenderness.  Abdominal: Soft. Bowel  sounds are normal. She exhibits no distension. There is no tenderness. There is no rebound and no guarding.  Vitals reviewed.         Assessment & Plan:  Colitis - Plan: ciprofloxacin (CIPRO) 500 MG tablet, metroNIDAZOLE (FLAGYL) 500 MG tablet, CBC with Differential/Platelet   I believe the patient has infectious colitis. Noninfectious colitis would be less likely. Begin Cipro 500 mg by mouth twice a day for 10 days. Use Flagyl 500 mg by mouth twice a day for  10 days. If symptoms are not improving over the next 48 hours I would proceed with a CT scan of the abdomen and pelvis. Given the blood-tinged stool, I would like to check a CBC to rule out significant anemia. He denies any recent travel. She denies any diarrhea

## 2014-10-18 ENCOUNTER — Encounter: Payer: Self-pay | Admitting: Family Medicine

## 2014-11-15 ENCOUNTER — Other Ambulatory Visit: Payer: Self-pay | Admitting: Family Medicine

## 2014-11-15 NOTE — Telephone Encounter (Signed)
Medication refilled per protocol. 

## 2014-12-12 ENCOUNTER — Encounter: Payer: Self-pay | Admitting: Family Medicine

## 2014-12-12 ENCOUNTER — Ambulatory Visit (INDEPENDENT_AMBULATORY_CARE_PROVIDER_SITE_OTHER): Payer: BLUE CROSS/BLUE SHIELD | Admitting: Family Medicine

## 2014-12-12 VITALS — BP 150/86 | HR 78 | Temp 98.3°F | Resp 16 | Ht 63.25 in | Wt 169.0 lb

## 2014-12-12 DIAGNOSIS — R1032 Left lower quadrant pain: Secondary | ICD-10-CM | POA: Diagnosis not present

## 2014-12-12 LAB — CBC W/MCH & 3 PART DIFF
HCT: 39.2 % (ref 36.0–46.0)
HEMOGLOBIN: 12.8 g/dL (ref 12.0–15.0)
LYMPHS ABS: 2.3 10*3/uL (ref 0.7–4.0)
LYMPHS PCT: 33 % (ref 12–46)
MCH: 31.6 pg (ref 26.0–34.0)
MCHC: 32.7 g/dL (ref 30.0–36.0)
MCV: 96.8 fL (ref 78.0–100.0)
NEUTROS PCT: 60 % (ref 43–77)
Neutro Abs: 4.1 10*3/uL (ref 1.7–7.7)
PLATELETS: 246 10*3/uL (ref 150–400)
RBC: 4.05 MIL/uL (ref 3.87–5.11)
RDW: 13.2 % (ref 11.5–15.5)
WBC mixed population: 0.5 10*3/uL (ref 0.1–1.8)
WBC: 6.9 10*3/uL (ref 4.0–10.5)
WBCMIXPER: 7 % (ref 3–18)

## 2014-12-12 MED ORDER — KETOROLAC TROMETHAMINE 60 MG/2ML IM SOLN
60.0000 mg | Freq: Once | INTRAMUSCULAR | Status: AC
  Administered 2014-12-12: 60 mg via INTRAMUSCULAR

## 2014-12-12 NOTE — Progress Notes (Signed)
Subjective:    Patient ID: [name], female    DOB: 28-Oct-1965, 49 y.o.   MRN: [id number]  HPI 10/17/14  patient presents today with several days of worsening left lower quadrant abdominal pain she is tender to palpation left lower quadrant. She has noticed some black tarry stools as well as some blood-tinged stools. She had a colonoscopy within the last year which revealed no cancerous polyps. She denies any fevers chills or weight loss. She denies any nausea or vomiting. She denies any constipation.  At that time, my plan was:  I believe the patient has infectious colitis. Noninfectious colitis would be less likely. Begin Cipro 500 mg by mouth twice a day for 10 days. Use Flagyl 500 mg by mouth twice a day for  10 days. If symptoms are not improving over the next 48 hours I would proceed with a CT scan of the abdomen and pelvis. Given the blood-tinged stool, I would like to check a CBC to rule out significant anemia. He denies any recent travel. She denies any diarrhea  12/12/14 She returns today again complaining of left lower quadrant abdominal pain. She states that the antibiotics never really helped the abdominal pain. The abdominal pain has been intermittent over the last 2 months. It typically comes and goes usually lasting several hours per day but then disappearing for more than a week. However she awoke this morning with very intense left lower quadrant abdominal pain that is unrelenting. She is exquisitely tender to palpation in the left lower quadrant today. She does have diminished bowel sounds bilaterally. She also has some guarding and rebound in the right lower quadrant. An urgent CBC obtained in the office shows a white blood cell count of 6. She is afebrile. She denies any constipation, diarrhea, or blood in her stool. She denies any nausea or vomiting. She denies any hematuria. She denies any vaginal discharge or vaginal bleeding. She denies any exacerbating or alleviating  factors Past Medical History  Diagnosis Date  . Metabolic syndrome   . Hyperlipidemia   . Hypertension   . Tracheal stenosis    Past Surgical History  Procedure Laterality Date  . Tubal ligation      1992  . Vaginal hysterectomy      partial 1995  . Tonsillectomy      age 89  . Adenoidectomy      age 89  . Cleft palate repair      infant   Current Outpatient Prescriptions on File Prior to Visit  Medication Sig Dispense Refill  . albuterol (PROVENTIL HFA;VENTOLIN HFA) 108 (90 BASE) MCG/ACT inhaler Inhale 2 puffs into the lungs every 6 (six) hours as needed for wheezing or shortness of breath. 1 Inhaler 3  . fexofenadine (ALLEGRA) 180 MG tablet Take 1 tablet (180 mg total) by mouth daily. 30 tablet 11  . fluticasone (FLONASE) 50 MCG/ACT nasal spray PLACE 2 SPRAYS INTO BOTH NOSTRILS DAILY. 16 g 5  . losartan (COZAAR) 50 MG tablet Take 1 tablet (50 mg total) by mouth daily. 90 tablet 3  . metoprolol (LOPRESSOR) 50 MG tablet Take 1 tablet (50 mg total) by mouth 2 (two) times daily. 60 tablet 11  . nitroGLYCERIN (NITROSTAT) 0.4 MG SL tablet Place 1 tablet (0.4 mg total) under the tongue every 5 (five) minutes as needed for chest pain. 30 tablet 12  . pravastatin (PRAVACHOL) 20 MG tablet TAKE 1 TABLET (20 MG TOTAL) BY MOUTH AT BEDTIME. 30 tablet 5  .  citalopram (CELEXA) 20 MG tablet TAKE 1 TABLET (20 MG TOTAL) BY MOUTH DAILY. (Patient not taking: Reported on 12/12/2014) 30 tablet 2   No current facility-administered medications on file prior to visit.   Allergies  Allergen Reactions  . Niacin And Related Other (See Comments)    Passes out   Social History   Social History  . Marital Status: Married    Spouse Name: N/A  . Number of Children: 2  . Years of Education: N/A   Occupational History  . office manager    Social History Main Topics  . Smoking status: Never Smoker   . Smokeless tobacco: Never Used  . Alcohol Use: No  . Drug Use: No  . Sexual Activity: Yes   Other  Topics Concern  . Not on file   Social History Narrative     Review of Systems  All other systems reviewed and are negative.      Objective:   Physical Exam  Cardiovascular: Normal rate, regular rhythm and normal heart sounds.   No murmur heard. Pulmonary/Chest: Effort normal and breath sounds normal. No respiratory distress. She has no wheezes. She has no rales. She exhibits no tenderness.  Abdominal: Soft. She exhibits no distension. Bowel sounds are decreased. There is tenderness. There is rebound and guarding.  Vitals reviewed.         Assessment & Plan:  Acute abdominal pain in left lower quadrant - Plan: CT Abdomen Pelvis W Contrast, CBC w/[facility] & 3 Part Diff there is a very broad differential diagnosis for acute left lower quadrant abdominal pain. Patient is in distress. She needs an urgent CT scan of the abdomen. I will give her 60 mg of Toradol IM 1  And try to schedule the CT scan immediately. Further plan will be dictated based upon the results of the CT scan. Received Toradol 60 mg IM 1

## 2014-12-12 NOTE — Addendum Note (Signed)
Addended by: Legrand RamsWILLIS, Saffron Busey B on: 12/12/2014 11:30 AM   Modules accepted: Orders

## 2014-12-12 NOTE — Progress Notes (Signed)
   Subjective:    Patient ID: [name], female    DOB: 03/27/1965, 49 y.o.   MRN: [id number]  HPI  CT revealed a 9.4 x 5 cm bilobed complex cystic mass in the pelvis adjacent to the right adnexa. Differential diagnosis includes complex ovarian cyst cystadenoma or possibly ovarian malignancy. I called and discussed this with the patient. I gave her Percocet 5/325 one to 2 tablets every 6 hours as needed for pain. I will refer her back to her gynecologist, [name], as soon as possible. This likely needs surgical excision and biopsy.  Review of Systems     Objective:   Physical Exam        Assessment & Plan:

## 2014-12-15 ENCOUNTER — Encounter: Payer: Self-pay | Admitting: Gynecologic Oncology

## 2014-12-15 ENCOUNTER — Ambulatory Visit: Payer: BLUE CROSS/BLUE SHIELD | Attending: Gynecologic Oncology | Admitting: Gynecologic Oncology

## 2014-12-15 VITALS — BP 131/82 | HR 88 | Temp 98.9°F | Resp 16 | Ht 63.25 in | Wt 165.1 lb

## 2014-12-15 DIAGNOSIS — N83201 Unspecified ovarian cyst, right side: Secondary | ICD-10-CM | POA: Insufficient documentation

## 2014-12-15 DIAGNOSIS — K5792 Diverticulitis of intestine, part unspecified, without perforation or abscess without bleeding: Secondary | ICD-10-CM | POA: Insufficient documentation

## 2014-12-15 DIAGNOSIS — K5712 Diverticulitis of small intestine without perforation or abscess without bleeding: Secondary | ICD-10-CM

## 2014-12-15 MED ORDER — CIPROFLOXACIN HCL 250 MG PO TABS: 250.0000 mg | ORAL_TABLET | Freq: Two times a day (BID) | ORAL | Status: DC

## 2014-12-15 MED ORDER — METRONIDAZOLE 500 MG PO TABS: 500.0000 mg | ORAL_TABLET | Freq: Three times a day (TID) | ORAL | Status: DC

## 2014-12-15 NOTE — Progress Notes (Signed)
Consult Note: Gyn-Onc  Consult was requested by Dr. Arelia SneddonMcComb for the evaluation of [name] C Thomason 49 y.o. female with a bilobed right ovarian cyst  CC:  Chief Complaint  Patient presents with  . Right Ovarian Cyst    New Consultation    Assessment/Plan:  [name]  is a 49 y.o.  year old with a bilobed right ovarian cyst, likely diverticulitis, and a normal CA 125.  I performed a history, physical examination, and personally reviewed the patient's imaging films including the imaging films from the CT abdo/pelvis of 12/12/14.  The patient has what is likely a benign right ovarian cystic mass (possibly diilated fallopian tubes). I suspect that these are benign given their appearance on imaging and the normal CA 125 and lack of associated changes consistent with ovarian cancer. However, I do believe they are symptomatic and very tender on exam, and may be co-infected from her diverticulitis.  I am recommending a course of cipro/flagyl x 1 week to cool off any significant pelvic infection. We will follow this with a robotic BSO, and possible lysis of adhesions.  I described to [name] that in the face of recent pelvic infection, there will likely be increased pelvic adhesive disease which increases her risk for having a surgical complication, including damage to visceral or GU organs and for requiring colostomy or bowel resection. I reviewed surgical risks including  bleeding, infection, damage to internal organs (such as bladder,ureters, bowels), blood clot, reoperation and rehospitalization.   HPI: [name] is a very pleasant 49 year old G2P2 who is seen in consultation at the request of Dr Arelia SneddonMcComb for ovarian cysts on US and CT.  The patient has a history of pelvic pain (left sided) 2 months ago and change in bowel habit. She was presumed to have colitis and treated with a course of cipro. This improved her symptoms and they resolved. However 4 days ago she developed acute  worsening of the pain again. This prompted a CT abdo/pelvis on 12/12/14 (the patient brought these images to her consultation and I reviewed them from the disc). This showed an aggregate cystic mass of 9.5x4.9cm (bilobed). It contained thick walls but no solid component.   A pelvic US was performed by Dr Arelia SneddonMcComb on 12/13/14 which showed no left ovary, and on the right ovary a 4.9x4.4cm and 4.2x4.6cm right ovarian thick walled simple cysts with no flow in the cysts.   She reports that her pain comes and goes but is always there somewhat. It wakes her up at night. She is unclear of relieving factors other than it is somewhat improved after BM's.  CA 125 from 12/13/14 was normal at 11U/mL.  She is otherwise fairly healthy. She is overweight (BMI 29) and has HTN and hypercholesterolemia. She has had no abdominal surgeries but has a history of a vaginal hysterectomy for menorrhagia. She also had "bladder tacking" at that time.   Current Meds:  Outpatient Encounter Prescriptions as of 12/15/2014  Medication Sig  . fexofenadine (ALLEGRA) 180 MG tablet Take 1 tablet (180 mg total) by mouth daily.  . fluticasone (FLONASE) 50 MCG/ACT nasal spray PLACE 2 SPRAYS INTO BOTH NOSTRILS DAILY.  Marland Kitchen. losartan (COZAAR) 50 MG tablet Take 1 tablet (50 mg total) by mouth daily.  . metoprolol (LOPRESSOR) 50 MG tablet Take 1 tablet (50 mg total) by mouth 2 (two) times daily.  Marland Kitchen. oxyCODONE-acetaminophen (PERCOCET/ROXICET) 5-325 MG tablet Take 1-2 tablets by mouth every 6 (six) hours as needed for  severe pain.  . pravastatin (PRAVACHOL) 20 MG tablet TAKE 1 TABLET (20 MG TOTAL) BY MOUTH AT BEDTIME.  . ciprofloxacin (CIPRO) 250 MG tablet Take 1 tablet (250 mg total) by mouth 2 (two) times daily.  . metroNIDAZOLE (FLAGYL) 500 MG tablet Take 1 tablet (500 mg total) by mouth 3 (three) times daily.  . nitroGLYCERIN (NITROSTAT) 0.4 MG SL tablet Place 1 tablet (0.4 mg total) under the tongue every 5 (five) minutes as needed for chest  pain. (Patient not taking: Reported on 12/15/2014)  . [DISCONTINUED] albuterol (PROVENTIL HFA;VENTOLIN HFA) 108 (90 BASE) MCG/ACT inhaler Inhale 2 puffs into the lungs every 6 (six) hours as needed for wheezing or shortness of breath. (Patient not taking: Reported on 12/15/2014)  . [DISCONTINUED] citalopram (CELEXA) 20 MG tablet TAKE 1 TABLET (20 MG TOTAL) BY MOUTH DAILY. (Patient not taking: Reported on 12/12/2014)   No facility-administered encounter medications on file as of 12/15/2014.    Allergy:  Allergies  Allergen Reactions  . Niacin And Related Other (See Comments)    Passes out    Social Hx:   Social History   Social History  . Marital Status: Married    Spouse Name: N/A  . Number of Children: 2  . Years of Education: N/A   Occupational History  . office manager    Social History Main Topics  . Smoking status: Never Smoker   . Smokeless tobacco: Never Used  . Alcohol Use: No  . Drug Use: No  . Sexual Activity: Yes   Other Topics Concern  . Not on file   Social History Narrative    Past Surgical Hx:  Past Surgical History  Procedure Laterality Date  . Tubal ligation      1992  . Vaginal hysterectomy      partial 1995  . Tonsillectomy      age 36  . Adenoidectomy      age 59  . Cleft palate repair      infant  . Other surgical history  05/2014, 09/2014    Tracheal bronchoscopy and dilation - performed by Dr. Harriette [facility]ara at [facility]    Past Medical Hx:  Past Medical History  Diagnosis Date  . Metabolic syndrome   . Hyperlipidemia   . Hypertension   . Tracheal stenosis     Past Gynecological History:  G2P2 SVD x 2  No LMP recorded. Patient has had a hysterectomy.  Family Hx:  Family History  Problem Relation Age of Onset  . Diabetes Father   . Vasculitis Father   . Heart disease Paternal Grandfather   . Lung cancer Maternal Grandmother     Review of Systems:  Constitutional  Feels well,    ENT Normal appearing ears and nares  bilaterally Skin/Breast  No rash, sores, jaundice, itching, dryness Cardiovascular  No chest pain, shortness of breath, or edema  Pulmonary  No cough or wheeze.  Gastro Intestinal  No nausea, vomitting, or diarrhoea. No bright red blood per rectum, + abdominal pain, No change in bowel movement, or constipation.  Genito Urinary  No frequency, urgency, dysuria, no vaginal bleeding Musculo Skeletal  No myalgia, arthralgia, joint swelling or pain  Neurologic  No weakness, numbness, change in gait,  Psychology  No depression, anxiety, insomnia.   Vitals:  Blood pressure 131/82, pulse 88, temperature 98.9 F (37.2 C), temperature source Oral, resp. rate 16, height 5' 3.25" (1.607 m), weight 165 lb 1.6 oz (74.889 kg).  Physical Exam: WD in NAD Neck  Supple NROM, without any enlargements.  Lymph Node Survey No cervical supraclavicular or inguinal adenopathy Cardiovascular  Pulse normal rate, regularity and rhythm. S1 and S2 normal.  Lungs  Clear to auscultation bilateraly, without wheezes/crackles/rhonchi. Good air movement.  Skin  No rash/lesions/breakdown  Psychiatry  Alert and oriented to person, place, and time  Abdomen  Normoactive bowel sounds, abdomen soft, and overweight without evidence of hernia. Tender in lower abdomen Back No CVA tenderness Genito Urinary  Vulva/vagina: Normal external female genitalia.  No lesions. No discharge or bleeding.  Bladder/urethra:  No lesions or masses, well supported bladder  Vagina: very tender at top of vagina. Appreciate cystic fullness at top of cuff.  Cervix: surgically absent  Uterus: surgically absent   Adnexa: fullness, rigid, firm 10 central mass. Rectal  Good tone, no masses no cul de sac nodularity. Mass does not feel fixed to rectum Extremities  No bilateral cyanosis, clubbing or edema.   Quinn Axe, MD  12/15/2014, 10:29 AM

## 2014-12-15 NOTE — Patient Instructions (Signed)
Preparing for your Surgery  Plan for surgery on 01/05/15 with Dr. Adolphus BirchwoodEmma Cardenas. You are scheduled for a Robotic assisted laparoscopic bilateral salpingo oophorectomy.  Pre-operative Testing -You will receive a phone call from [facility] to arrange for a pre-operative testing appointment before your surgery.  This appointment normally occurs one to two weeks before your scheduled surgery.   -Bring your insurance card, copy of an advanced directive if applicable, medication list  -At that visit, you will be asked to sign a consent for a possible blood transfusion in case a transfusion becomes necessary during surgery.  The need for a blood transfusion is rare but having consent is a necessary part of your care.     -You should not be taking blood thinners or aspirin at least ten days prior to surgery unless instructed by your surgeon.  Day Before Surgery at Home -You will be asked to take in only clear liquids the day before surgery.  Examples of clear liquids include broths, jello, and clear juices.  Avoid carbonated beverages.  You will be advised to have nothing to eat or drink after midnight the evening before.    Your role in recovery Your role is to become active as soon as directed by your doctor, while still giving yourself time to heal.  Rest when you feel tired. You will be asked to do the following in order to speed your recovery:  - Cough and breathe deeply. This helps toclear and expand your lungs and can prevent pneumonia. You may be given a spirometer to practice deep breathing. A staff member will show you how to use the spirometer. - Do mild physical activity. Walking or moving your legs help your circulation and body functions return to normal. A staff member will help you when you try to walk and will provide you with simple exercises. Do not try to get up or walk alone the first time. - Actively manage your pain. Managing your pain  lets you move in comfort. We will ask you to rate your pain on a scale of zero to 10. It is your responsibility to tell your doctor or nurse where and how much you hurt so your pain can be treated.  Special Considerations -If you are diabetic, you may be placed on insulin after surgery to have closer control over your blood sugars to promote healing and recovery.  This does not mean that you will be discharged on insulin.  If applicable, your oral antidiabetics will be resumed when you are tolerating a solid diet.  -Your final pathology results from surgery should be available by the Friday after surgery and the results will be relayed to you when available.  Blood Transfusion Information WHAT IS A BLOOD TRANSFUSION? A transfusion is the replacement of blood or some of its parts. Blood is made up of multiple cells which provide different functions.  Red blood cells carry oxygen and are used for blood loss replacement.  White blood cells fight against infection.  Platelets control bleeding.  Plasma helps clot blood.  Other blood products are available for specialized needs, such as hemophilia or other clotting disorders. BEFORE THE TRANSFUSION  Who gives blood for transfusions?   You may be able to donate blood to be used at a later date on yourself (autologous donation).  Relatives can be asked to donate blood. This is generally not any safer than if you have received blood from a stranger. The same precautions are taken  to ensure safety when a relative's blood is donated.  Healthy volunteers who are fully evaluated to make sure their blood is safe. This is blood bank blood. Transfusion therapy is the safest it has ever been in the practice of medicine. Before blood is taken from a donor, a complete history is taken to make sure that person has no history of diseases nor engages in risky social behavior (examples are intravenous drug use or sexual activity with multiple partners). The  donor's travel history is screened to minimize risk of transmitting infections, such as malaria. The donated blood is tested for signs of infectious diseases, such as HIV and hepatitis. The blood is then tested to be sure it is compatible with you in order to minimize the chance of a transfusion reaction. If you or a relative donates blood, this is often done in anticipation of surgery and is not appropriate for emergency situations. It takes many days to process the donated blood. RISKS AND COMPLICATIONS Although transfusion therapy is very safe and saves many lives, the main dangers of transfusion include:   Getting an infectious disease.  Developing a transfusion reaction. This is an allergic reaction to something in the blood you were given. Every precaution is taken to prevent this. The decision to have a blood transfusion has been considered carefully by your caregiver before blood is given. Blood is not given unless the benefits outweigh the risks.

## 2014-12-16 IMAGING — CR DG CHEST 2V
2 series · 2 of 2 positions shown · non-contrast
Comparison: May 16, 2008

CLINICAL DATA: Chest pain

EXAM:
CHEST  2 VIEW

[w chest pa]
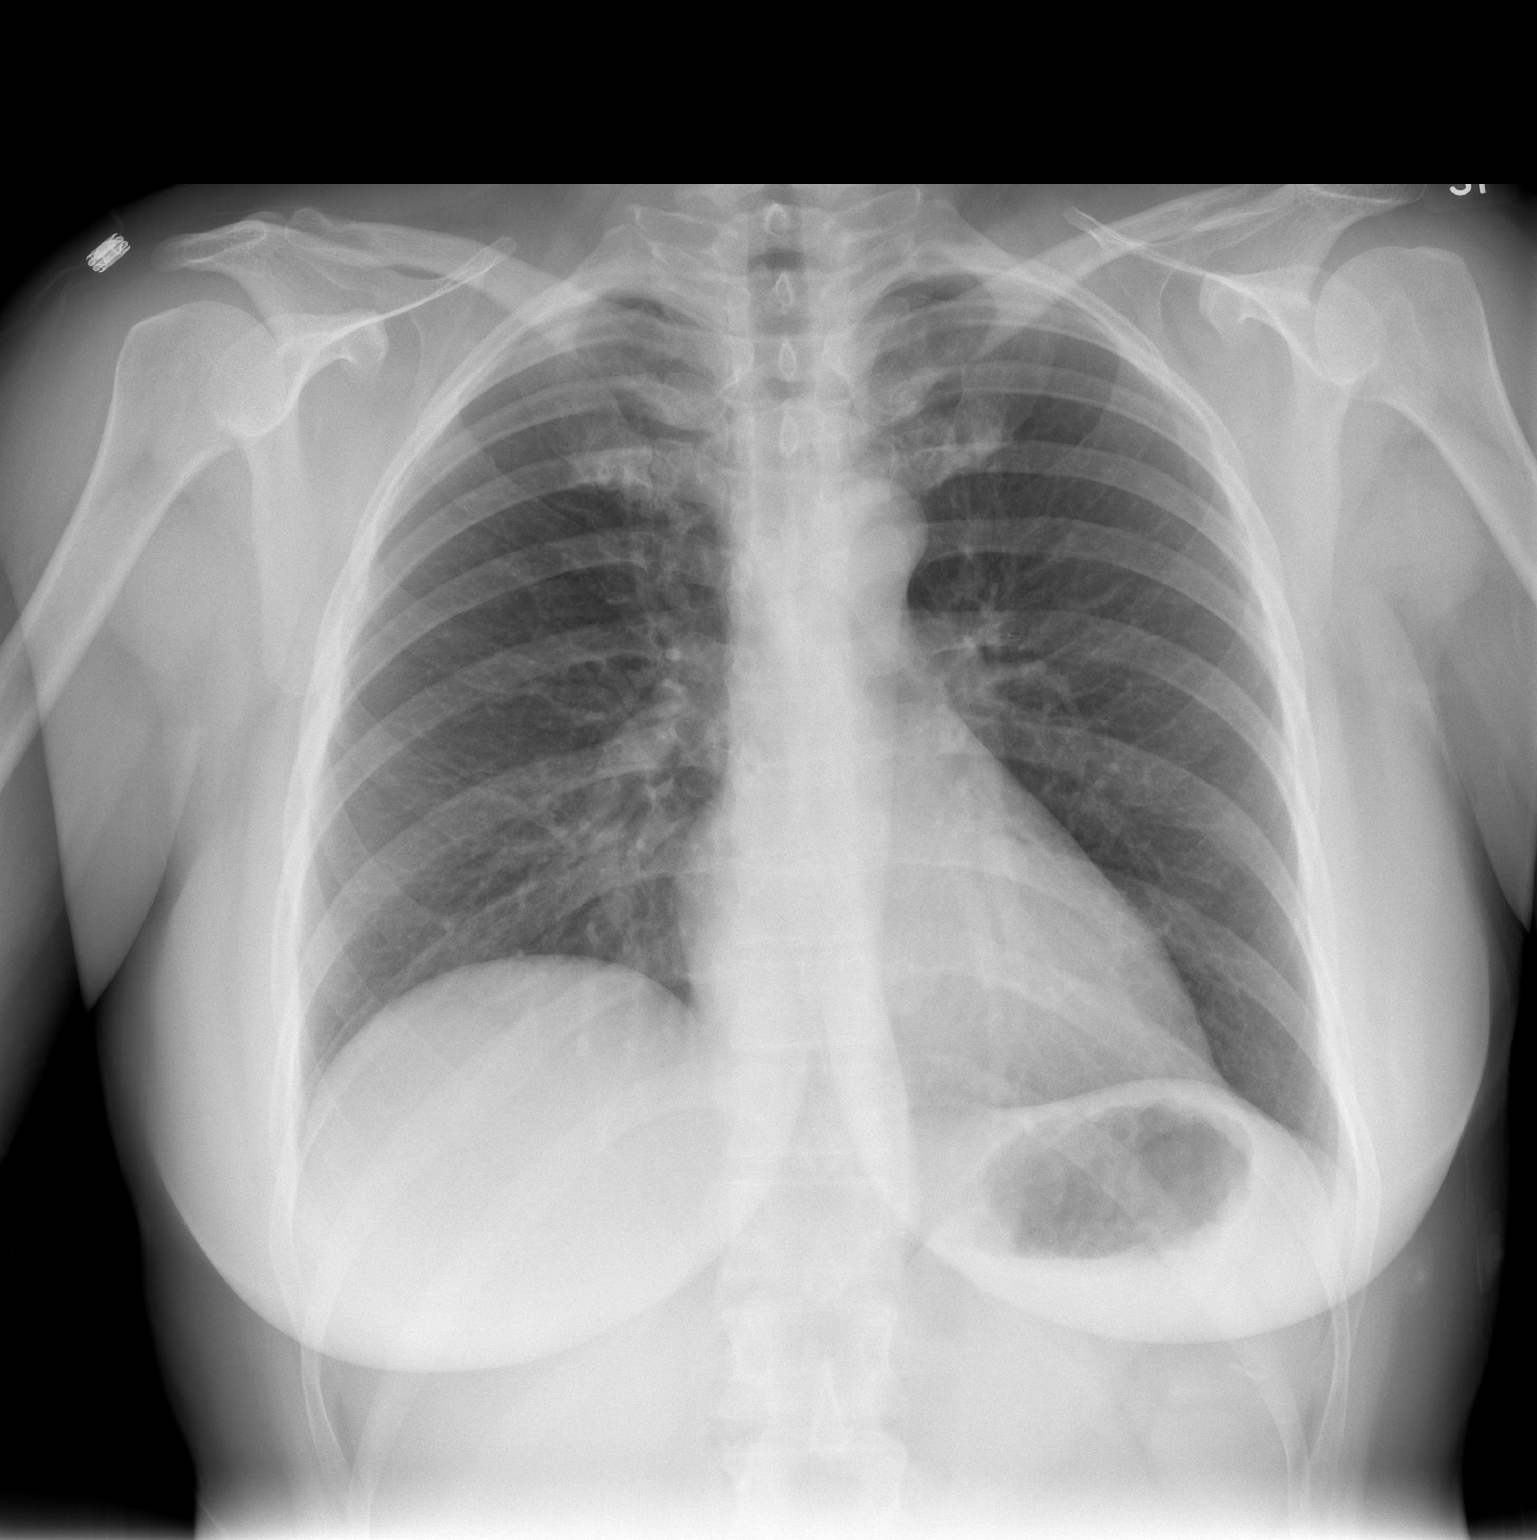

[w chest lat]
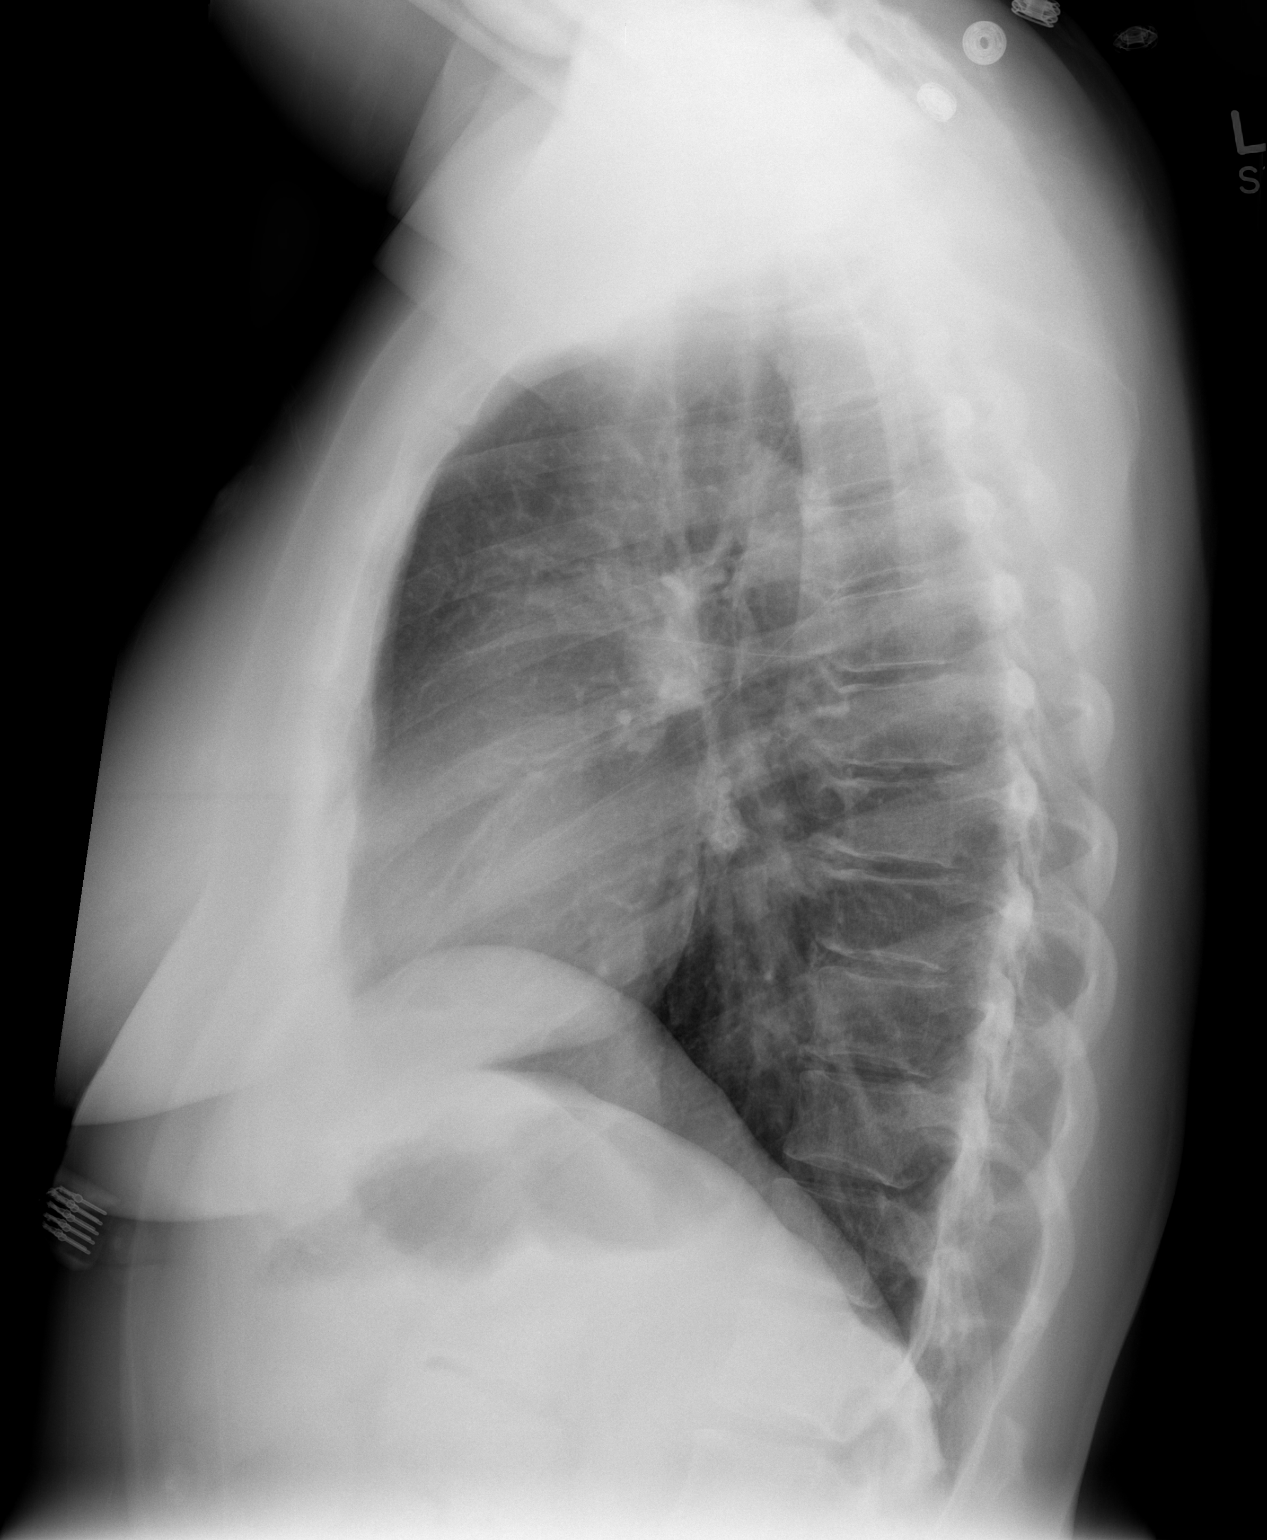

[2 of 2 positions shown; findings below may reference images not displayed]

FINDINGS: Lungs are clear. Heart size and pulmonary vascularity are normal. No
adenopathy. No pneumothorax. No bone lesions.
IMPRESSION: No abnormality noted.

## 2014-12-20 ENCOUNTER — Other Ambulatory Visit: Payer: Self-pay | Admitting: *Deleted

## 2014-12-20 DIAGNOSIS — R1030 Lower abdominal pain, unspecified: Secondary | ICD-10-CM

## 2014-12-20 MED ORDER — TRAMADOL HCL 50 MG PO TABS: 50.0000 mg | ORAL_TABLET | Freq: Four times a day (QID) | ORAL | Status: DC | PRN

## 2014-12-20 NOTE — Progress Notes (Signed)
Received phone call from patient stating she is still experiencing low grade fevers and lower abdominal pain/tenderness. She states she is taking cipro and flagyl as prescribed by Dr. Andrey Farmerossi. Patient states there is a refill on both antibiotics and she is inquiring if she needs to refill medications. Pt also states she is trying to work part time and that when she is working she takes tylenol for the pain and it is ineffective. Per Warner MccreedyMelissa Cross, NP prescription for Tramadol 50 mg tablets called into patient's pharmacy. Patient notified of this and agreeable to pickup prescription. Instructed patient to call her PCP and notify him of current situation as he originally treated her several months ago for similar abdominal symptoms - patient agreeable to this and will call our office with any additional questions or concerns.

## 2014-12-21 ENCOUNTER — Telehealth: Payer: Self-pay

## 2014-12-21 NOTE — Telephone Encounter (Signed)
Patient's call returned , patient states she continues to have abdominal "pain" and was askiung if she needs additional antibiotics . Melissa Cross , APNP updated with patient's concerns , orders received to to inform that patient that she does not need additional antibiotics .  Also the  should follow up with her PCP for the pain she is having as this is not related to her diagnosis . Patient agrees to plan and will contact her PCP . Patient denies additional questions or concerns at this time.

## 2014-12-23 ENCOUNTER — Ambulatory Visit (INDEPENDENT_AMBULATORY_CARE_PROVIDER_SITE_OTHER): Payer: BLUE CROSS/BLUE SHIELD | Admitting: Family Medicine

## 2014-12-23 ENCOUNTER — Encounter: Payer: Self-pay | Admitting: Family Medicine

## 2014-12-23 VITALS — BP 132/80 | HR 60 | Temp 98.1°F | Resp 18 | Wt 163.0 lb

## 2014-12-23 DIAGNOSIS — R1032 Left lower quadrant pain: Secondary | ICD-10-CM | POA: Diagnosis not present

## 2014-12-23 DIAGNOSIS — Z23 Encounter for immunization: Secondary | ICD-10-CM

## 2014-12-23 DIAGNOSIS — N83291 Other ovarian cyst, right side: Secondary | ICD-10-CM

## 2014-12-23 NOTE — Progress Notes (Signed)
Subjective:    Patient ID: [name], female    DOB: Dec 07, 1965, 49 y.o.   MRN: [id number]  HPI 10/17/14  patient presents today with several days of worsening left lower quadrant abdominal pain she is tender to palpation left lower quadrant. She has noticed some black tarry stools as well as some blood-tinged stools. She had a colonoscopy within the last year which revealed no cancerous polyps. She denies any fevers chills or weight loss. She denies any nausea or vomiting. She denies any constipation.  At that time, my plan was:  I believe the patient has infectious colitis. Noninfectious colitis would be less likely. Begin Cipro 500 mg by mouth twice a day for 10 days. Use Flagyl 500 mg by mouth twice a day for  10 days. If symptoms are not improving over the next 48 hours I would proceed with a CT scan of the abdomen and pelvis. Given the blood-tinged stool, I would like to check a CBC to rule out significant anemia. He denies any recent travel. She denies any diarrhea  12/12/14 She returns today again complaining of left lower quadrant abdominal pain. She states that the antibiotics never really helped the abdominal pain. The abdominal pain has been intermittent over the last 2 months. It typically comes and goes usually lasting several hours per day but then disappearing for more than a week. However she awoke this morning with very intense left lower quadrant abdominal pain that is unrelenting. She is exquisitely tender to palpation in the left lower quadrant today. She does have diminished bowel sounds bilaterally. She also has some guarding and rebound in the right lower quadrant. An urgent CBC obtained in the office shows a white blood cell count of 6. She is afebrile. She denies any constipation, diarrhea, or blood in her stool. She denies any nausea or vomiting. She denies any hematuria. She denies any vaginal discharge or vaginal bleeding. She denies any exacerbating or alleviating  factors.  AT that time, my plan was: here is a very broad differential diagnosis for acute left lower quadrant abdominal pain. Patient is in distress. She needs an urgent CT scan of the abdomen. I will give her 60 mg of Toradol IM 1  And try to schedule the CT scan immediately. Further plan will be dictated based upon the results of the CT scan. Received Toradol 60 mg IM 1  12/23/14 CT revealed a 9.4 x 5 cm bilobed complex cystic mass in the pelvis adjacent to the right adnexa. Differential diagnosis includes complex ovarian cyst cystadenoma or possibly ovarian malignancy. I called and discussed this with the patient. I gave her Percocet 5/325 one to 2 tablets every 6 hours as needed for pain. I will refer her back to her gynecologist, Dr. Arelia Sneddon, as soon as possible. This likely needs surgical excision and biopsy.  She has since seen her gynecologist. CA-125 was normal which is reassuring. They started her on antibiotics for possible diverticulitis and to calm down any possible pelvic infection prior to surgery.  She is scheduled for surgery on November 17 under the care of Dr. Adolphus Birchwood.  I have reviewed their notes.  She has completed 1 week of Cipro and Flagyl. She was running a low-grade fever for the entire week. Her fever just subsided yesterday. The abdominal pain improved dramatically on antibiotics. However she only had a 7 day prescription. Past Medical History  Diagnosis Date  . Metabolic syndrome   . Hyperlipidemia   . Hypertension   .  Tracheal stenosis    Past Surgical History  Procedure Laterality Date  . Tubal ligation      1992  . Vaginal hysterectomy      partial 1995  . Tonsillectomy      age 81  . Adenoidectomy      age 57  . Cleft palate repair      infant  . Other surgical history  05/2014, 09/2014    Tracheal bronchoscopy and dilation - performed by Dr. Harriette Ohara at [facility]   Current Outpatient Prescriptions on File Prior to Visit  Medication Sig Dispense Refill   . ciprofloxacin (CIPRO) 250 MG tablet Take 1 tablet (250 mg total) by mouth 2 (two) times daily. 14 tablet 1  . fexofenadine (ALLEGRA) 180 MG tablet Take 1 tablet (180 mg total) by mouth daily. 30 tablet 11  . fluticasone (FLONASE) 50 MCG/ACT nasal spray PLACE 2 SPRAYS INTO BOTH NOSTRILS DAILY. 16 g 5  . losartan (COZAAR) 50 MG tablet Take 1 tablet (50 mg total) by mouth daily. 90 tablet 3  . metoprolol (LOPRESSOR) 50 MG tablet Take 1 tablet (50 mg total) by mouth 2 (two) times daily. 60 tablet 11  . metroNIDAZOLE (FLAGYL) 500 MG tablet Take 1 tablet (500 mg total) by mouth 3 (three) times daily. 21 tablet 1  . nitroGLYCERIN (NITROSTAT) 0.4 MG SL tablet Place 1 tablet (0.4 mg total) under the tongue every 5 (five) minutes as needed for chest pain. (Patient not taking: Reported on 12/15/2014) 30 tablet 12  . oxyCODONE-acetaminophen (PERCOCET/ROXICET) 5-325 MG tablet Take 1-2 tablets by mouth every 6 (six) hours as needed for severe pain.    . pravastatin (PRAVACHOL) 20 MG tablet TAKE 1 TABLET (20 MG TOTAL) BY MOUTH AT BEDTIME. 30 tablet 5  . traMADol (ULTRAM) 50 MG tablet Take 1 tablet (50 mg total) by mouth every 6 (six) hours as needed (pain). 30 tablet 0   No current facility-administered medications on file prior to visit.   Allergies  Allergen Reactions  . Niacin And Related Other (See Comments)    Passes out   Social History   Social History  . Marital Status: Married    Spouse Name: N/A  . Number of Children: 2  . Years of Education: N/A   Occupational History  . office manager    Social History Main Topics  . Smoking status: Never Smoker   . Smokeless tobacco: Never Used  . Alcohol Use: No  . Drug Use: No  . Sexual Activity: Yes   Other Topics Concern  . Not on file   Social History Narrative     Review of Systems  All other systems reviewed and are negative.      Objective:   Physical Exam  Cardiovascular: Normal rate, regular rhythm and normal heart  sounds.   No murmur heard. Pulmonary/Chest: Effort normal and breath sounds normal. No respiratory distress. She has no wheezes. She has no rales. She exhibits no tenderness.  Abdominal: Soft. Bowel sounds are normal. She exhibits no distension. There is tenderness. There is no rebound and no guarding.  Vitals reviewed.         Assessment & Plan:  Complex cyst of right ovary  Acute abdominal pain in left lower quadrant  Need for prophylactic vaccination and inoculation against influenza - Plan: CANCELED: Flu Vaccine QUAD 36+ mos IM  I do believe the complex cyst is causing her pain. However I cannot explain the fever based only on the cyst. It  is possible that she has some type of infectious colitis. I would treat infectious colitis for 14 days particularly given the fact she is about to have abdominal surgery. I want to avoid any type of peritonitis. Therefore I would extend the Cipro and Flagyl for an additional week. If after the surgery she continues to have intermittent lower abdominal pain, I would recommend a GI consultation for repeat colonoscopy.

## 2014-12-29 ENCOUNTER — Encounter: Payer: Self-pay | Admitting: Physician Assistant

## 2014-12-29 ENCOUNTER — Ambulatory Visit (INDEPENDENT_AMBULATORY_CARE_PROVIDER_SITE_OTHER): Payer: BLUE CROSS/BLUE SHIELD | Admitting: Physician Assistant

## 2014-12-29 VITALS — BP 124/84 | HR 76 | Temp 98.2°F | Resp 18 | Wt 160.0 lb

## 2014-12-29 DIAGNOSIS — T887XXA Unspecified adverse effect of drug or medicament, initial encounter: Secondary | ICD-10-CM | POA: Diagnosis not present

## 2014-12-29 DIAGNOSIS — T50905A Adverse effect of unspecified drugs, medicaments and biological substances, initial encounter: Secondary | ICD-10-CM

## 2014-12-29 MED ORDER — PREDNISONE 20 MG PO TABS: ORAL_TABLET | ORAL | Status: DC

## 2014-12-29 NOTE — Patient Instructions (Addendum)
DESA RECH  12/29/2014   Your procedure is scheduled on: 01-05-15 Thursday  Report to [facility] Main  Entrance take [facility]  elevators to 3rd floor to  Short Stay Center at 5:30 AM.  Call this number if you have problems the morning of surgery [phone]   Remember: ONLY 1 PERSON MAY GO WITH YOU TO SHORT STAY TO GET  READY MORNING OF YOUR SURGERY.  Do not eat food after midnight Tuesday.  Follow clear liquid diet beginning AM on Wednesday.  Then nothing by mouth after midnight on Wednesday.    Take these medicines the morning of surgery with A SIP OF WATER: Fexofenadine (Allegra). Metoprolol (Lopressor). Use/bring Flonase spray DO NOT TAKE ANY DIABETIC MEDICATIONS DAY OF YOUR SURGERY                               You may not have any metal on your body including hair pins and              piercings  Do not wear jewelry, make-up, lotions, powders or perfumes, deodorant             Do not wear nail polish.  Do not shave  48 hours prior to surgery.  .   Do not bring valuables to the hospital. [facility] IS NOT             RESPONSIBLE   FOR VALUABLES.  Contacts, dentures or bridgework may not be worn into surgery. .     Patients discharged the day of surgery will not be allowed to drive home.  Name and phone number of your driver:  Special Instructions: coughing and deep breathing exercises, leg exercises              Please read over the following fact sheets you were given: _____________________________________________________________________             [facility] - Preparing for Surgery Before surgery, you can play an important role.  Because skin is not sterile, your skin needs to be as free of germs as possible.  You can reduce the number of germs on your skin by washing with CHG (chlorahexidine gluconate) soap before surgery.  CHG is an antiseptic cleaner which kills germs and bonds with the skin to continue killing germs even after  washing. Please DO NOT use if you have an allergy to CHG or antibacterial soaps.  If your skin becomes reddened/irritated stop using the CHG and inform your nurse when you arrive at Short Stay. Do not shave (including legs and underarms) for at least 48 hours prior to the first CHG shower.  You may shave your face/neck. Please follow these instructions carefully:  1.  Shower with CHG Soap the night before surgery and the  morning of Surgery.  2.  If you choose to wash your hair, wash your hair first as usual with your  normal  shampoo.  3.  After you shampoo, rinse your hair and body thoroughly to remove the  shampoo.                           4.  Use CHG as you would any other liquid soap.  You can apply chg directly  to the skin and wash  Gently with a scrungie or clean washcloth.  5.  Apply the CHG Soap to your body ONLY FROM THE NECK DOWN.   Do not use on face/ open                           Wound or open sores. Avoid contact with eyes, ears mouth and genitals (private parts).                       Wash face,  Genitals (private parts) with your normal soap.             6.  Wash thoroughly, paying special attention to the area where your surgery  will be performed.  7.  Thoroughly rinse your body with warm water from the neck down.  8.  DO NOT shower/wash with your normal soap after using and rinsing off  the CHG Soap.                9.  Pat yourself dry with a clean towel.            10.  Wear clean pajamas.            11.  Place clean sheets on your bed the night of your first shower and do not  sleep with pets. Day of Surgery : Do not apply any lotions/deodorants the morning of surgery.  Please wear clean clothes to the hospital/surgery center.  FAILURE TO FOLLOW THESE INSTRUCTIONS MAY RESULT IN THE CANCELLATION OF YOUR SURGERY PATIENT SIGNATURE_________________________________  NURSE  SIGNATURE__________________________________  ________________________________________________________________________   [name]  An incentive spirometer is a tool that can help keep your lungs clear and active. This tool measures how well you are filling your lungs with each breath. Taking long deep breaths may help reverse or decrease the chance of developing breathing (pulmonary) problems (especially infection) following:  A long period of time when you are unable to move or be active. BEFORE THE PROCEDURE   If the spirometer includes an indicator to show your best effort, your nurse or respiratory therapist will set it to a desired goal.  If possible, sit up straight or lean slightly forward. Try not to slouch.  Hold the incentive spirometer in an upright position. INSTRUCTIONS FOR USE   Sit on the edge of your bed if possible, or sit up as far as you can in bed or on a chair.  Hold the incentive spirometer in an upright position.  Breathe out normally.  Place the mouthpiece in your mouth and seal your lips tightly around it.  Breathe in slowly and as deeply as possible, raising the piston or the ball toward the top of the column.  Hold your breath for 3-5 seconds or for as long as possible. Allow the piston or ball to fall to the bottom of the column.  Remove the mouthpiece from your mouth and breathe out normally.  Rest for a few seconds and repeat Steps 1 through 7 at least 10 times every 1-2 hours when you are awake. Take your time and take a few normal breaths between deep breaths.  The spirometer may include an indicator to show your best effort. Use the indicator as a goal to work toward during each repetition.  After each set of 10 deep breaths, practice coughing to be sure your lungs are clear. If you have an incision (the cut made at the time of surgery),  support your incision when coughing by placing a pillow or rolled up towels firmly against it. Once  you are able to get out of bed, walk around indoors and cough well. You may stop using the incentive spirometer when instructed by your caregiver.  RISKS AND COMPLICATIONS  Take your time so you do not get dizzy or light-headed.  If you are in pain, you may need to take or ask for pain medication before doing incentive spirometry. It is harder to take a deep breath if you are having pain. AFTER USE  Rest and breathe slowly and easily.  It can be helpful to keep track of a log of your progress. Your caregiver can provide you with a simple table to help with this. If you are using the spirometer at home, follow these instructions: [place] IF:   You are having difficultly using the spirometer.  You have trouble using the spirometer as often as instructed.  Your pain medication is not giving enough relief while using the spirometer.  You develop fever of 100.5 F (38.1 C) or higher. SEEK IMMEDIATE MEDICAL CARE IF:   You cough up bloody sputum that had not been present before.  You develop fever of 102 F (38.9 C) or greater.  You develop worsening pain at or near the incision site. MAKE SURE YOU:   Understand these instructions.  Will watch your condition.  Will get help right away if you are not doing well or get worse. Document Released: 06/17/2006 Document Revised: 04/29/2011 Document Reviewed: 08/18/2006 ExitCare Patient Information 2014 ExitCare, [place].   ________________________________________________________________________  WHAT IS A BLOOD TRANSFUSION? Blood Transfusion Information  A transfusion is the replacement of blood or some of its parts. Blood is made up of multiple cells which provide different functions.  Red blood cells carry oxygen and are used for blood loss replacement.  White blood cells fight against infection.  Platelets control bleeding.  Plasma helps clot blood.  Other blood products are available for specialized needs, such as  hemophilia or other clotting disorders. BEFORE THE TRANSFUSION  Who gives blood for transfusions?   Healthy volunteers who are fully evaluated to make sure their blood is safe. This is blood bank blood. Transfusion therapy is the safest it has ever been in the practice of medicine. Before blood is taken from a donor, a complete history is taken to make sure that person has no history of diseases nor engages in risky social behavior (examples are intravenous drug use or sexual activity with multiple partners). The donor's travel history is screened to minimize risk of transmitting infections, such as malaria. The donated blood is tested for signs of infectious diseases, such as HIV and hepatitis. The blood is then tested to be sure it is compatible with you in order to minimize the chance of a transfusion reaction. If you or a relative donates blood, this is often done in anticipation of surgery and is not appropriate for emergency situations. It takes many days to process the donated blood. RISKS AND COMPLICATIONS Although transfusion therapy is very safe and saves many lives, the main dangers of transfusion include:   Getting an infectious disease.  Developing a transfusion reaction. This is an allergic reaction to something in the blood you were given. Every precaution is taken to prevent this. The decision to have a blood transfusion has been considered carefully by your caregiver before blood is given. Blood is not given unless the benefits outweigh the risks. AFTER THE TRANSFUSION  Right after receiving a blood transfusion, you will usually feel much better and more energetic. This is especially true if your red blood cells have gotten low (anemic). The transfusion raises the level of the red blood cells which carry oxygen, and this usually causes an energy increase.  The nurse administering the transfusion will monitor you carefully for complications. HOME CARE INSTRUCTIONS  No special  instructions are needed after a transfusion. You may find your energy is better. Speak with your caregiver about any limitations on activity for underlying diseases you may have. SEEK MEDICAL CARE IF:   Your condition is not improving after your transfusion.  You develop redness or irritation at the intravenous (IV) site. SEEK IMMEDIATE MEDICAL CARE IF:  Any of the following symptoms occur over the next 12 hours:  Shaking chills.  You have a temperature by mouth above 102 F (38.9 C), not controlled by medicine.  Chest, back, or muscle pain.  People around you feel you are not acting correctly or are confused.  Shortness of breath or difficulty breathing.  Dizziness and fainting.  You get a rash or develop hives.  You have a decrease in urine output.  Your urine turns a dark color or changes to pink, red, or brown. Any of the following symptoms occur over the next 10 days:  You have a temperature by mouth above 102 F (38.9 C), not controlled by medicine.  Shortness of breath.  Weakness after normal activity.  The white part of the eye turns yellow (jaundice).  You have a decrease in the amount of urine or are urinating less often.  Your urine turns a dark color or changes to pink, red, or brown. Document Released: 02/02/2000 Document Revised: 04/29/2011 Document Reviewed: 09/21/2007 ExitCare Patient Information 2014 ExitCare, [place].  _______________________________________________________________________   CLEAR LIQUID DIET   Foods Allowed                                                                     Foods Excluded  Coffee and tea, regular and decaf                             liquids that you cannot  Plain Jell-O in any flavor                                             see through such as: Fruit ices (not with fruit pulp)                                     milk, soups, orange juice  Iced Popsicles                                    All solid  food Carbonated beverages, regular and diet  Cranberry, grape and apple juices Sports drinks like Gatorade Lightly seasoned clear broth or consume(fat free) Sugar, honey syrup  Sample Menu Breakfast                                Lunch                                     Supper Cranberry juice                    Beef broth                            Chicken broth Jell-O                                     Grape juice                           Apple juice Coffee or tea                        Jell-O                                      Popsicle                                                Coffee or tea                        Coffee or tea  _____________________________________________________________________

## 2014-12-29 NOTE — Progress Notes (Signed)
Patient ID: [name] MRN: [id number], DOB: 1965/03/03, 49 y.o. Date of Encounter: 12/29/2014, 4:55 PM    Chief Complaint:  Chief Complaint  Patient presents with  . rash on legs    ? from antibiotics, pending surgery next week     HPI: 50 y.o. year old white female states that she has been having problems with her ovaries and cysts and is scheduled to have them removed next week. Says she also has been on Flagyl and Cipro and was given 7 days with a refill. Says that she saw Dr. Tanya Nones last week and discussed with him whether to take that refill. States that she developed this rash this  Tuesday and quit the antibiotics on Tuesday. Says that she has been taking Benadryl at night. Says that she decided to come get this evaluated because she wants to make sure that her surgery which is scheduled for next Thursday does not get postponed over this issue.     Home Meds:   Outpatient Prescriptions Prior to Visit  Medication Sig Dispense Refill  . fexofenadine (ALLEGRA) 180 MG tablet Take 1 tablet (180 mg total) by mouth daily. 30 tablet 11  . fluticasone (FLONASE) 50 MCG/ACT nasal spray PLACE 2 SPRAYS INTO BOTH NOSTRILS DAILY. 16 g 5  . losartan (COZAAR) 50 MG tablet Take 1 tablet (50 mg total) by mouth daily. 90 tablet 3  . metoprolol (LOPRESSOR) 50 MG tablet Take 1 tablet (50 mg total) by mouth 2 (two) times daily. 60 tablet 11  . nitroGLYCERIN (NITROSTAT) 0.4 MG SL tablet Place 1 tablet (0.4 mg total) under the tongue every 5 (five) minutes as needed for chest pain. 30 tablet 12  . pravastatin (PRAVACHOL) 20 MG tablet TAKE 1 TABLET (20 MG TOTAL) BY MOUTH AT BEDTIME. (Patient taking differently: Take 10 mg by mouth at bedtime. ) 30 tablet 5  . traMADol (ULTRAM) 50 MG tablet Take 1 tablet (50 mg total) by mouth every 6 (six) hours as needed (pain). 30 tablet 0  . ciprofloxacin (CIPRO) 250 MG tablet Take 1 tablet (250 mg total) by mouth 2 (two) times daily. (Patient not taking:  Reported on 12/29/2014) 14 tablet 1  . metroNIDAZOLE (FLAGYL) 500 MG tablet Take 1 tablet (500 mg total) by mouth 3 (three) times daily. (Patient not taking: Reported on 12/29/2014) 21 tablet 1   No facility-administered medications prior to visit.    Allergies:  Allergies  Allergen Reactions  . Niacin And Related Other (See Comments)    Passes out      Review of Systems: See HPI for pertinent ROS. All other ROS negative.    Physical Exam: Blood pressure 124/84, pulse 76, temperature 98.2 F (36.8 C), temperature source Oral, resp. rate 18, weight 160 lb (72.576 kg)., Body mass index is 28.1 kg/(m^2). General:  WNWD WF. Appears in no acute distress. Neck: Supple. No thyromegaly. No lymphadenopathy. Lungs: Clear bilaterally to auscultation without wheezes, rales, or rhonchi. Breathing is unlabored. Heart: Regular rhythm. No murmurs, rubs, or gallops. Msk:  Strength and tone normal for age. Extremities/Skin: Bilateral shins/calves with Macular,NonBlanchable, Purplish/Red splotches----each approx 1cm diameter, but scattered--densely along lower shins, more scattered on upper shins. Very few lighter pink macules scattered on low abdomen. No other areas affected.    Neuro: Alert and oriented X 3. Moves all extremities spontaneously. Gait is normal. CNII-XII grossly in tact. Psych:  Responds to questions appropriately with a normal affect.     ASSESSMENT AND PLAN:  49  y.o. year old female with  1. Medication reaction, initial encounter Discussed that she definitely needs to stay off the Flagyl and Cipro. Discussed that, most likely, just being off of these medications-- the rash will resolve quite a bit by Thursday. Go ahead and continue the Benadryl and also can add Pepcid and will add prednisone as this may help speed up resolution. Follow-up if needed. - predniSONE (DELTASONE) 20 MG tablet; Take 3 daily for 2 days, then 2 daily for 2 days, then 1 daily for 2 days.  Dispense: 12  tablet; Refill: 0   Signed, [address] John CourtMary Beth StratfordDixon, [place]PA, [facility] 12/29/2014 4:55 PM

## 2014-12-30 ENCOUNTER — Encounter (HOSPITAL_COMMUNITY): Payer: Self-pay | Admitting: Anesthesiology

## 2014-12-30 ENCOUNTER — Encounter (HOSPITAL_COMMUNITY)
Admission: RE | Admit: 2014-12-30 | Discharge: 2014-12-30 | Disposition: A | Discharge status: Home / Self Care | Payer: BLUE CROSS/BLUE SHIELD | Source: Ambulatory Visit | Attending: Gynecologic Oncology | Admitting: Gynecologic Oncology

## 2014-12-30 ENCOUNTER — Telehealth: Payer: Self-pay | Admitting: Pulmonary Disease

## 2014-12-30 ENCOUNTER — Encounter (HOSPITAL_COMMUNITY): Payer: Self-pay

## 2014-12-30 DIAGNOSIS — Z01810 Encounter for preprocedural cardiovascular examination: Secondary | ICD-10-CM | POA: Diagnosis not present

## 2014-12-30 DIAGNOSIS — Z01812 Encounter for preprocedural laboratory examination: Secondary | ICD-10-CM | POA: Insufficient documentation

## 2014-12-30 LAB — CBC
HEMATOCRIT: 40.3 % (ref 36.0–46.0)
Hemoglobin: 13.4 g/dL (ref 12.0–15.0)
MCH: 31.8 pg (ref 26.0–34.0)
MCHC: 33.3 g/dL (ref 30.0–36.0)
MCV: 95.5 fL (ref 78.0–100.0)
PLATELETS: 436 10*3/uL — AB (ref 150–400)
RBC: 4.22 MIL/uL (ref 3.87–5.11)
RDW: 12.5 % (ref 11.5–15.5)
WBC: 9.5 10*3/uL (ref 4.0–10.5)

## 2014-12-30 LAB — BASIC METABOLIC PANEL WITH GFR
Anion gap: 7 (ref 5–15)
BUN: 15 mg/dL (ref 6–20)
CO2: 27 mmol/L (ref 22–32)
Calcium: 9.5 mg/dL (ref 8.9–10.3)
Chloride: 104 mmol/L (ref 101–111)
Creatinine, Ser: 0.69 mg/dL (ref 0.44–1.00)
GFR calc Af Amer: >60 mL/min
GFR calc non Af Amer: >60 mL/min
Glucose, Bld: 97 mg/dL (ref 65–99)
Potassium: 4.8 mmol/L (ref 3.5–5.1)
Sodium: 138 mmol/L (ref 135–145)

## 2014-12-30 NOTE — Anesthesia Preprocedure Evaluation (Deleted)
Anesthesia Evaluation  Patient identified by MRN, date of birth, ID band Patient awake    Reviewed: Allergy & Precautions, NPO status , Patient's Chart, lab work & pertinent test results  Airway Mallampati: II  TM Distance: >3 FB Neck ROM: Full    Dental no notable dental hx.    Pulmonary neg shortness of breath,  H/O idiopathic subglottic stenosis. [facility] by Dr. Delford FieldWright in April and August 2016.  Notes available in [facility] care everywhere.  She has been told her trachea is now normal sized. No current symptoms.   Pulmonary exam normal breath sounds clear to auscultation       Cardiovascular Exercise Tolerance: Good hypertension, Pt. on medications + DOE  Normal cardiovascular exam Rhythm:Regular Rate:Normal     Neuro/Psych Anxiety negative neurological ROS     GI/Hepatic negative GI ROS, Neg liver ROS,   Endo/Other  negative endocrine ROS  Renal/GU negative Renal ROS  negative genitourinary   Musculoskeletal negative musculoskeletal ROS (+)   Abdominal   Peds negative pediatric ROS (+)  Hematology negative hematology ROS (+)   Anesthesia Other Findings   Reproductive/Obstetrics Currently 3/10 abdominal pain from ovary cyst.                       Anesthesia Physical Anesthesia Plan  ASA: II  Anesthesia Plan: General   Post-op Pain Management:    Induction: Intravenous  Airway Management Planned: Oral ETT  Additional Equipment:   Intra-op Plan:   Post-operative Plan: Extubation in OR  Informed Consent: I have reviewed the patients History and Physical, chart, labs and discussed the procedure including the risks, benefits and alternatives for the proposed anesthesia with the patient or authorized representative who has indicated his/her understanding and acceptance.   Dental advisory given  Plan Discussed with:  CRNA  Anesthesia Plan Comments: (Idiopathic subglottic stenosis:  [facility]. It sounds like her trachea is currently not a problem.  Will have smaller sized ETT available.  Operative notes are available on EPIC care everywhere. She was noted to be an easy mask ventilation before airway surgery 09-29-14  Please see [facility] who recommends using 4.0 ETT.)      Anesthesia Quick Evaluation

## 2014-12-30 NOTE — Progress Notes (Signed)
12-29-14 - LOV - M. Dixon, PA (fam.med.) - EPIC 12-15-14 - LOV - Dr. Andrey Farmerossi (gyn onc) - EPIC 09-29-14 - Dr. Delford FieldWright - Voice and Swallowing Disorders - [facility] - surgical procedure - Care Everywhere - EPIC 06-13-14 - LOV - Dr. Shelle Ironlance (pulmon) - EPIC 06-08-14 - Pulmonary Function Test - EPIC 03-14-14 - Echo - EPIC 03-03-14 - CXR - EPIC

## 2014-12-30 NOTE — Progress Notes (Addendum)
12-30-14 - Dr. Council Mechanicenenny talked with patient at preop appointment on 12-30-14 regarding trachea stenosis.  He requested the surgical operative notes from Dr. Delford FieldWright from April & August of 2016 (laser and dilation of trachea).       Called the Otolaryngology Head and Neck Surgery department at 1440 on 12-30-14 (Dr. Delford FieldWright) and was told by switchboard that the office closes at 12 noon on Friday.  They took message and said that we may need to have medical release signed by patient before they can send them but would call me back first of the week for clarification.  12-30-14 at 1500 talked to [place] at [facility] to see if they have any information regarding surgical procedures by Dr. Delford FieldWright since Dr. Shelle Ironlance referred her to him. Mitzi DavenportShelby said she will have someone call me back.   12-30-14 - Talked to Mardella LaymanLindsey at [facility].  Will fax office notes received by Dr. Shelle Ironlance from Dr. Delford FieldWright. Received office notes and showed to anesthesia.  Dr. Council Mechanicenenny was able to look at Dr. Lynelle DoctorWright's operative notes on Care Everywhere and stated all he needs is a surgical clearance from Dr. Delford FieldWright with any recommendations  Needed pertaining to the surgery.

## 2014-12-30 NOTE — Telephone Encounter (Signed)
Spoke with Pam. She is needing some results faxed over to her. These have been sent over her. Nothing further was needed.

## 2014-12-30 NOTE — Progress Notes (Addendum)
06-15-14 - Dr. Delford FieldWright (ENT/Head & Neck Surgery) [facility] - office visit notes - in chart 09-27-14 - Dr. Delford FieldWright ([facility]) - office visit notes - in chart    Operative notes for April and August of 2016 from Dr. Delford FieldWright [facility] - in chart

## 2015-01-02 NOTE — Progress Notes (Addendum)
01-02-15 @ 0900 - Talked to Annabelle Harmanana at [facility] department of Otolaryngology Head and Neck Surgery, that anesthesia (Dr. Council Mechanicenenny) requested surgical clearance (pt. surgery scheduled for 01-05-15) with any recommendations regarding surgery (intubation).  Annabelle HarmanDana stated she would forward message to Diplomatic Services operational officersecretary and Dr. Lynelle DoctorWright's nurse.  PST fax number given.  Dr. Delford FieldWright - [facility] - Otelaryngology Head and Neck Surgery Phone - [phone] Fax [phone]

## 2015-01-04 NOTE — Progress Notes (Signed)
Note from DR Viann Shovearter Wright with letter of recommendations related to intubation and surgery.  Placed on front of chart.

## 2015-01-04 NOTE — Progress Notes (Signed)
[facility] and spoke with nurse - Dorene SorrowJerry - in the Otolaryngology , Head and Neck Surgery Department.  Requested again clearance for surgery for patient and any recommendations for surgery to be faxed.  She stated Dr Delford FieldWright in clinic today and she would take this request to him immediately.

## 2015-01-04 NOTE — Progress Notes (Addendum)
At 1500pm on 01/04/15 anesthesia ( Dr Council Mechanicenenny) saw note placed on front of chart from Dr [facility].

## 2015-01-05 ENCOUNTER — Ambulatory Visit (HOSPITAL_COMMUNITY)
Admission: RE | Admit: 2015-01-05 | Payer: BLUE CROSS/BLUE SHIELD | Source: Ambulatory Visit | Admitting: Gynecologic Oncology

## 2015-01-05 ENCOUNTER — Encounter (HOSPITAL_COMMUNITY): Admission: RE | Payer: Self-pay | Source: Ambulatory Visit

## 2015-01-05 LAB — TYPE AND SCREEN
ABO/RH(D): O POS
Antibody Screen: NEGATIVE

## 2015-01-05 SURGERY — ROBOTIC ASSISTED BILATERAL SALPINGO OOPHORECTOMY
Anesthesia: General | Laterality: Bilateral

## 2015-01-06 ENCOUNTER — Telehealth: Payer: Self-pay | Admitting: Gynecologic Oncology

## 2015-01-06 NOTE — Telephone Encounter (Signed)
Called to talk with the patient about current status and when her appt was with her ENT.  She is reporting she is still having abdominal pain and wants to have surgery as soon as possible.  She is to see her ENT on Monday for measurements.  "I had it done in April and in August.  I am hoping it will be better than 50% and I can proceed."  Advised to call the office after her appt on Monday with an update.  Advised that Dr. Oliver Humossi's next OR time would be Dec 13 so we would hold a spot for her pending ENT assessment.  Advised to call for any questions or concerns as well.

## 2015-01-09 ENCOUNTER — Telehealth: Payer: Self-pay | Admitting: Gynecologic Oncology

## 2015-01-09 NOTE — Telephone Encounter (Signed)
Returned call to patient.  Left message asking her to call the office.  Patient left message earlier stating she had some information from her appointment today with ENT.

## 2015-01-10 ENCOUNTER — Telehealth: Payer: Self-pay | Admitting: Gynecologic Oncology

## 2015-01-10 NOTE — Telephone Encounter (Signed)
Patient returned call to the office.  Patient stating she has received surgical clearance from her ENT, Dr. Delford [facility]right at [facility].  Stating her physician spoke with anesthesia, Dr. Council Mechanicenenny, who was going to reach out to Dr. Andrey Farmerossi.  Patient advised that her new surgical date would be Dec 13.  She would like to be moved to a sooner date if cancellation arises.  All questions answered.  Advised to call for any needs.

## 2015-01-11 ENCOUNTER — Encounter: Payer: Self-pay | Admitting: Physician Assistant

## 2015-01-27 ENCOUNTER — Telehealth: Payer: Self-pay | Admitting: Family Medicine

## 2015-01-27 ENCOUNTER — Encounter (HOSPITAL_COMMUNITY)
Admission: RE | Admit: 2015-01-27 | Discharge: 2015-01-27 | Disposition: A | Discharge status: Home / Self Care | Payer: BLUE CROSS/BLUE SHIELD | Source: Ambulatory Visit | Attending: Gynecologic Oncology | Admitting: Gynecologic Oncology

## 2015-01-27 ENCOUNTER — Encounter (HOSPITAL_COMMUNITY): Payer: Self-pay

## 2015-01-27 DIAGNOSIS — Z01818 Encounter for other preprocedural examination: Secondary | ICD-10-CM | POA: Diagnosis not present

## 2015-01-27 DIAGNOSIS — N83201 Unspecified ovarian cyst, right side: Secondary | ICD-10-CM | POA: Diagnosis not present

## 2015-01-27 LAB — CBC
HCT: 40.9 % (ref 36.0–46.0)
HEMOGLOBIN: 13.6 g/dL (ref 12.0–15.0)
MCH: 31.8 pg (ref 26.0–34.0)
MCHC: 33.3 g/dL (ref 30.0–36.0)
MCV: 95.6 fL (ref 78.0–100.0)
Platelets: 321 10*3/uL (ref 150–400)
RBC: 4.28 MIL/uL (ref 3.87–5.11)
RDW: 13.1 % (ref 11.5–15.5)
WBC: 7.9 10*3/uL (ref 4.0–10.5)

## 2015-01-27 LAB — COMPREHENSIVE METABOLIC PANEL
ALBUMIN: 4.6 g/dL (ref 3.5–5.0)
ALK PHOS: 72 U/L (ref 38–126)
ALT: 44 U/L (ref 14–54)
AST: 37 U/L (ref 15–41)
Anion gap: 9 (ref 5–15)
BUN: 13 mg/dL (ref 6–20)
CALCIUM: 9.5 mg/dL (ref 8.9–10.3)
CO2: 30 mmol/L (ref 22–32)
CREATININE: 0.78 mg/dL (ref 0.44–1.00)
Chloride: 101 mmol/L (ref 101–111)
GFR calc Af Amer: >60 mL/min (ref >60)
GFR calc non Af Amer: >60 mL/min (ref >60)
GLUCOSE: 116 mg/dL — AB (ref 65–99)
Potassium: 4.5 mmol/L (ref 3.5–5.1)
SODIUM: 140 mmol/L (ref 135–145)
Total Bilirubin: 2.1 mg/dL — ABNORMAL HIGH (ref 0.3–1.2)
Total Protein: 7.5 g/dL (ref 6.5–8.1)

## 2015-01-27 NOTE — Patient Instructions (Signed)
[name]  01/27/2015   Your procedure is scheduled on: Tuesday 01-31-15   Report to [facility]  Entrance take [facility]  elevators to 3rd floor to  Short Stay Center at 9:00 AM.  Call this number if you have problems the morning of surgery [phone]   Remember: ONLY 1 PERSON MAY GO WITH YOU TO SHORT STAY TO GET  READY MORNING OF YOUR SURGERY.  Do not eat food or drink liquids :After Midnight.     Take these medicines the morning of surgery with A SIP OF WATER: Metoprolol, Flonase if needed, Allegra if needed                                You may not have any metal on your body including hair pins and              piercings  Do not wear jewelry, make-up, lotions, powders or perfumes, deodorant             Do not wear nail polish.  Do not shave  48 hours prior to surgery.              Men may shave face and neck.   Do not bring valuables to the hospital.  IS NOT             RESPONSIBLE   FOR VALUABLES.  Contacts, dentures or bridgework may not be worn into surgery.  Leave suitcase in the car. After surgery it may be brought to your room.     Patients discharged the day of surgery will not be allowed to drive home.  Name and phone number of your driver:  Special Instructions: N/A              Please read over the following fact sheets you were given: _____________________________________________________________________             [facility] - Preparing for Surgery Before surgery, you can play an important role.  Because skin is not sterile, your skin needs to be as free of germs as possible.  You can reduce the number of germs on your skin by washing with CHG (chlorahexidine gluconate) soap before surgery.  CHG is an antiseptic cleaner which kills germs and bonds with the skin to continue killing germs even after washing. Please DO NOT use if you have an allergy to CHG or antibacterial soaps.  If your skin becomes reddened/irritated  stop using the CHG and inform your nurse when you arrive at Short Stay. Do not shave (including legs and underarms) for at least 48 hours prior to the first CHG shower.  You may shave your face/neck. Please follow these instructions carefully:  1.  Shower with CHG Soap the night before surgery and the  morning of Surgery.  2.  If you choose to wash your hair, wash your hair first as usual with your  normal  shampoo.  3.  After you shampoo, rinse your hair and body thoroughly to remove the  shampoo.                                        4.  Use CHG as you would any other liquid soap.  You  can apply chg directly  to the skin and wash                       Gently with a scrungie or clean washcloth.  5.  Apply the CHG Soap to your body ONLY FROM THE NECK DOWN.   Do not use on face/ open                           Wound or open sores. Avoid contact with eyes, ears mouth and genitals (private parts).                       Wash face,  Genitals (private parts) with your normal soap.             6.  Wash thoroughly, paying special attention to the area where your surgery  will be performed.  7.  Thoroughly rinse your body with warm water from the neck down.  8.  DO NOT shower/wash with your normal soap after using and rinsing off  the CHG Soap.                9.  Pat yourself dry with a clean towel.            10.  Wear clean pajamas.            11.  Place clean sheets on your bed the night of your first shower and do not  sleep with pets. Day of Surgery : Do not apply any lotions/deodorants the morning of surgery.  Please wear clean clothes to the hospital/surgery center.  FAILURE TO FOLLOW THESE INSTRUCTIONS MAY RESULT IN THE CANCELLATION OF YOUR SURGERY PATIENT SIGNATURE_________________________________  NURSE SIGNATURE__________________________________  ________________________________________________________________________   [name]  An incentive spirometer is a tool that can  help keep your lungs clear and active. This tool measures how well you are filling your lungs with each breath. Taking long deep breaths may help reverse or decrease the chance of developing breathing (pulmonary) problems (especially infection) following:  A long period of time when you are unable to move or be active. BEFORE THE PROCEDURE   If the spirometer includes an indicator to show your best effort, your nurse or respiratory therapist will set it to a desired goal.  If possible, sit up straight or lean slightly forward. Try not to slouch.  Hold the incentive spirometer in an upright position. INSTRUCTIONS FOR USE   Sit on the edge of your bed if possible, or sit up as far as you can in bed or on a chair.  Hold the incentive spirometer in an upright position.  Breathe out normally.  Place the mouthpiece in your mouth and seal your lips tightly around it.  Breathe in slowly and as deeply as possible, raising the piston or the ball toward the top of the column.  Hold your breath for 3-5 seconds or for as long as possible. Allow the piston or ball to fall to the bottom of the column.  Remove the mouthpiece from your mouth and breathe out normally.  Rest for a few seconds and repeat Steps 1 through 7 at least 10 times every 1-2 hours when you are awake. Take your time and take a few normal breaths between deep breaths.  The spirometer may include an indicator to show your best effort. Use the indicator as a goal to work toward during  each repetition.  After each set of 10 deep breaths, practice coughing to be sure your lungs are clear. If you have an incision (the cut made at the time of surgery), support your incision when coughing by placing a pillow or rolled up towels firmly against it. Once you are able to get out of bed, walk around indoors and cough well. You may stop using the incentive spirometer when instructed by your caregiver.  RISKS AND COMPLICATIONS  Take your time  so you do not get dizzy or light-headed.  If you are in pain, you may need to take or ask for pain medication before doing incentive spirometry. It is harder to take a deep breath if you are having pain. AFTER USE  Rest and breathe slowly and easily.  It can be helpful to keep track of a log of your progress. Your caregiver can provide you with a simple table to help with this. If you are using the spirometer at home, follow these instructions: SEEK MEDICAL CARE IF:   You are having difficultly using the spirometer.  You have trouble using the spirometer as often as instructed.  Your pain medication is not giving enough relief while using the spirometer.  You develop fever of 100.5 F (38.1 C) or higher. SEEK IMMEDIATE MEDICAL CARE IF:   You cough up bloody sputum that had not been present before.  You develop fever of 102 F (38.9 C) or greater.  You develop worsening pain at or near the incision site. MAKE SURE YOU:   Understand these instructions.  Will watch your condition.  Will get help right away if you are not doing well or get worse. Document Released: 06/17/2006 Document Revised: 04/29/2011 Document Reviewed: 08/18/2006 ExitCare Patient Information 2014 ExitCare, [place].   ________________________________________________________________________  WHAT IS A BLOOD TRANSFUSION? Blood Transfusion Information  A transfusion is the replacement of blood or some of its parts. Blood is made up of multiple cells which provide different functions.  Red blood cells carry oxygen and are used for blood loss replacement.  White blood cells fight against infection.  Platelets control bleeding.  Plasma helps clot blood.  Other blood products are available for specialized needs, such as hemophilia or other clotting disorders. BEFORE THE TRANSFUSION  Who gives blood for transfusions?   Healthy volunteers who are fully evaluated to make sure their blood is safe. This is  blood bank blood. Transfusion therapy is the safest it has ever been in the practice of medicine. Before blood is taken from a donor, a complete history is taken to make sure that person has no history of diseases nor engages in risky social behavior (examples are intravenous drug use or sexual activity with multiple partners). The donor's travel history is screened to minimize risk of transmitting infections, such as malaria. The donated blood is tested for signs of infectious diseases, such as HIV and hepatitis. The blood is then tested to be sure it is compatible with you in order to minimize the chance of a transfusion reaction. If you or a relative donates blood, this is often done in anticipation of surgery and is not appropriate for emergency situations. It takes many days to process the donated blood. RISKS AND COMPLICATIONS Although transfusion therapy is very safe and saves many lives, the main dangers of transfusion include:   Getting an infectious disease.  Developing a transfusion reaction. This is an allergic reaction to something in the blood you were given. Every precaution is taken to prevent  this. The decision to have a blood transfusion has been considered carefully by your caregiver before blood is given. Blood is not given unless the benefits outweigh the risks. AFTER THE TRANSFUSION  Right after receiving a blood transfusion, you will usually feel much better and more energetic. This is especially true if your red blood cells have gotten low (anemic). The transfusion raises the level of the red blood cells which carry oxygen, and this usually causes an energy increase.  The nurse administering the transfusion will monitor you carefully for complications. HOME CARE INSTRUCTIONS  No special instructions are needed after a transfusion. You may find your energy is better. Speak with your caregiver about any limitations on activity for underlying diseases you may have. SEEK MEDICAL  CARE IF:   Your condition is not improving after your transfusion.  You develop redness or irritation at the intravenous (IV) site. SEEK IMMEDIATE MEDICAL CARE IF:  Any of the following symptoms occur over the next 12 hours:  Shaking chills.  You have a temperature by mouth above 102 F (38.9 C), not controlled by medicine.  Chest, back, or muscle pain.  People around you feel you are not acting correctly or are confused.  Shortness of breath or difficulty breathing.  Dizziness and fainting.  You get a rash or develop hives.  You have a decrease in urine output.  Your urine turns a dark color or changes to pink, red, or brown. Any of the following symptoms occur over the next 10 days:  You have a temperature by mouth above 102 F (38.9 C), not controlled by medicine.  Shortness of breath.  Weakness after normal activity.  The white part of the eye turns yellow (jaundice).  You have a decrease in the amount of urine or are urinating less often.  Your urine turns a dark color or changes to pink, red, or brown. Document Released: 02/02/2000 Document Revised: 04/29/2011 Document Reviewed: 09/21/2007 [facility] Patient Information 2014 [place].  _______________________________________________________________________

## 2015-01-27 NOTE — Telephone Encounter (Signed)
Patient calling to speak with you regarding her ct scan being denied  [phone]

## 2015-01-27 NOTE — Pre-Procedure Instructions (Addendum)
EKG 12-30-14 epic 2D Echo 03-14-14 epic CXR 03-03-14 epic ENT Clearance with recommendations, Dr. Delford FieldWright 01-04-15 Care Everywhere Dr. Council Mechanicenenny [facility] ANESTHESIA) note on pt 12-30-14  Notified Dr. Acey Lavarignan pt is here with history of tracheal dilation and has clearance from ENT with recommendations.  Dr. Acey Lavarignan does not need to see pt at this time.

## 2015-01-30 ENCOUNTER — Encounter (HOSPITAL_COMMUNITY): Payer: Self-pay | Admitting: Anesthesiology

## 2015-01-30 NOTE — Anesthesia Preprocedure Evaluation (Addendum)
Anesthesia Evaluation  Patient identified by MRN, date of birth, ID band Patient awake    Reviewed: Allergy & Precautions, NPO status , Patient's Chart, lab work & pertinent test results, reviewed documented beta blocker date and time   Airway Mallampati: II  TM Distance: >3 FB Neck ROM: Full    Dental no notable dental hx. (+) Teeth Intact, Dental Advisory Given   Pulmonary neg shortness of breath,  Tracheal stenosis   Pulmonary exam normal breath sounds clear to auscultation       Cardiovascular Exercise Tolerance: Good hypertension, Pt. on medications and Pt. on home beta blockers + DOE  Normal cardiovascular exam Rhythm:Regular Rate:Normal     Neuro/Psych Anxiety negative neurological ROS     GI/Hepatic negative GI ROS, Neg liver ROS,   Endo/Other  negative endocrine ROS  Renal/GU negative Renal ROS  negative genitourinary   Musculoskeletal negative musculoskeletal ROS (+)   Abdominal   Peds negative pediatric ROS (+)  Hematology negative hematology ROS (+)   Anesthesia Other Findings   Reproductive/Obstetrics Currently 3/10 abdominal pain from ovary cyst.                       Anesthesia Physical  Anesthesia Plan  ASA: II  Anesthesia Plan: General   Post-op Pain Management:    Induction: Intravenous  Airway Management Planned: Oral ETT  Additional Equipment:   Intra-op Plan:   Post-operative Plan: Extubation in OR and Possible Post-op intubation/ventilation  Informed Consent: I have reviewed the patients History and Physical, chart, labs and discussed the procedure including the risks, benefits and alternatives for the proposed anesthesia with the patient or authorized representative who has indicated his/her understanding and acceptance.   Dental advisory given  Plan Discussed with: CRNA  Anesthesia Plan Comments: (Idiopathic subglottic stenosis:  [facility] It sounds like her trachea is currently not a problem.  Will have smaller sized ETT available.  Operative notes are available on EPIC care everywhere. She was noted to be an easy mask ventilation before airway surgery 09-29-14  01-30-15: spoke with Dr. Delford FieldWright recently and he has examined her in his office. He states she is "90% open" and that we may safely proceed.  [facility]     Anesthesia Quick Evaluation

## 2015-01-30 NOTE — Telephone Encounter (Signed)
Contacted pt and in process of appeal

## 2015-01-31 ENCOUNTER — Ambulatory Visit (HOSPITAL_COMMUNITY): Payer: BLUE CROSS/BLUE SHIELD | Admitting: Anesthesiology

## 2015-01-31 ENCOUNTER — Encounter (HOSPITAL_COMMUNITY): Payer: Self-pay

## 2015-01-31 ENCOUNTER — Ambulatory Visit (HOSPITAL_COMMUNITY)
Admission: RE | Admit: 2015-01-31 | Discharge: 2015-01-31 | Disposition: A | Discharge status: Home / Self Care | Payer: BLUE CROSS/BLUE SHIELD | Source: Ambulatory Visit | Attending: Gynecologic Oncology | Admitting: Gynecologic Oncology

## 2015-01-31 ENCOUNTER — Encounter (HOSPITAL_COMMUNITY)
Admission: RE | Disposition: A | Discharge status: Home / Self Care | Payer: Self-pay | Source: Ambulatory Visit | Attending: Gynecologic Oncology

## 2015-01-31 DIAGNOSIS — Z79899 Other long term (current) drug therapy: Secondary | ICD-10-CM | POA: Diagnosis not present

## 2015-01-31 DIAGNOSIS — Z7951 Long term (current) use of inhaled steroids: Secondary | ICD-10-CM | POA: Diagnosis not present

## 2015-01-31 DIAGNOSIS — N8311 Corpus luteum cyst of right ovary: Secondary | ICD-10-CM | POA: Diagnosis not present

## 2015-01-31 DIAGNOSIS — N83201 Unspecified ovarian cyst, right side: Secondary | ICD-10-CM | POA: Diagnosis present

## 2015-01-31 DIAGNOSIS — Z7901 Long term (current) use of anticoagulants: Secondary | ICD-10-CM | POA: Insufficient documentation

## 2015-01-31 DIAGNOSIS — Z79891 Long term (current) use of opiate analgesic: Secondary | ICD-10-CM | POA: Insufficient documentation

## 2015-01-31 DIAGNOSIS — N83512 Torsion of left ovary and ovarian pedicle: Secondary | ICD-10-CM

## 2015-01-31 DIAGNOSIS — I10 Essential (primary) hypertension: Secondary | ICD-10-CM | POA: Insufficient documentation

## 2015-01-31 DIAGNOSIS — E785 Hyperlipidemia, unspecified: Secondary | ICD-10-CM | POA: Insufficient documentation

## 2015-01-31 DIAGNOSIS — E7800 Pure hypercholesterolemia, unspecified: Secondary | ICD-10-CM | POA: Insufficient documentation

## 2015-01-31 DIAGNOSIS — N838 Other noninflammatory disorders of ovary, fallopian tube and broad ligament: Secondary | ICD-10-CM | POA: Diagnosis not present

## 2015-01-31 HISTORY — DX: Other specified postprocedural states: Z98.890

## 2015-01-31 HISTORY — DX: Other specified postprocedural states: R11.2

## 2015-01-31 HISTORY — PX: ROBOTIC ASSISTED BILATERAL SALPINGO OOPHERECTOMY: SHX6078

## 2015-01-31 LAB — TYPE AND SCREEN
ABO/RH(D): O POS
Antibody Screen: NEGATIVE

## 2015-01-31 SURGERY — ROBOTIC ASSISTED BILATERAL SALPINGO OOPHORECTOMY
Anesthesia: General | Laterality: Bilateral

## 2015-01-31 MED ORDER — CEFAZOLIN SODIUM-DEXTROSE 2-3 GM-% IV SOLR
INTRAVENOUS | Status: AC
  Filled 2015-01-31: qty 50

## 2015-01-31 MED ORDER — LACTATED RINGERS IV SOLN
INTRAVENOUS | Status: DC | PRN
  Administered 2015-01-31 (×2): via INTRAVENOUS

## 2015-01-31 MED ORDER — ARTIFICIAL TEARS OP OINT
TOPICAL_OINTMENT | OPHTHALMIC | Status: AC
  Filled 2015-01-31: qty 3.5

## 2015-01-31 MED ORDER — GLYCOPYRROLATE 0.2 MG/ML IJ SOLN
INTRAMUSCULAR | Status: DC | PRN
  Administered 2015-01-31: 0.2 mg via INTRAVENOUS
  Administered 2015-01-31: 0.4 mg via INTRAVENOUS

## 2015-01-31 MED ORDER — ONDANSETRON HCL 4 MG/2ML IJ SOLN
INTRAMUSCULAR | Status: DC | PRN
  Administered 2015-01-31: 4 mg via INTRAVENOUS

## 2015-01-31 MED ORDER — HYDROMORPHONE HCL 1 MG/ML IJ SOLN
INTRAMUSCULAR | Status: AC
  Filled 2015-01-31: qty 1

## 2015-01-31 MED ORDER — NEOSTIGMINE METHYLSULFATE 10 MG/10ML IV SOLN
INTRAVENOUS | Status: DC | PRN
  Administered 2015-01-31: 3.5 mg via INTRAVENOUS

## 2015-01-31 MED ORDER — PROPOFOL 10 MG/ML IV BOLUS
INTRAVENOUS | Status: DC | PRN
  Administered 2015-01-31: 150 mg via INTRAVENOUS

## 2015-01-31 MED ORDER — FENTANYL CITRATE (PF) 100 MCG/2ML IJ SOLN
INTRAMUSCULAR | Status: AC
  Filled 2015-01-31: qty 2

## 2015-01-31 MED ORDER — FENTANYL CITRATE (PF) 100 MCG/2ML IJ SOLN
INTRAMUSCULAR | Status: DC | PRN
  Administered 2015-01-31 (×7): 50 ug via INTRAVENOUS

## 2015-01-31 MED ORDER — ROCURONIUM BROMIDE 100 MG/10ML IV SOLN
INTRAVENOUS | Status: DC | PRN
  Administered 2015-01-31: 40 mg via INTRAVENOUS

## 2015-01-31 MED ORDER — PHENYLEPHRINE 40 MCG/ML (10ML) SYRINGE FOR IV PUSH (FOR BLOOD PRESSURE SUPPORT)
PREFILLED_SYRINGE | INTRAVENOUS | Status: AC
  Filled 2015-01-31: qty 10

## 2015-01-31 MED ORDER — SODIUM CHLORIDE 0.9 % IJ SOLN: 3.0000 mL | Freq: Two times a day (BID) | INTRAMUSCULAR | Status: DC

## 2015-01-31 MED ORDER — SUCCINYLCHOLINE CHLORIDE 20 MG/ML IJ SOLN
INTRAMUSCULAR | Status: DC | PRN
  Administered 2015-01-31: 100 mg via INTRAVENOUS

## 2015-01-31 MED ORDER — ONDANSETRON HCL 4 MG/2ML IJ SOLN
4.0000 mg | Freq: Once | INTRAMUSCULAR | Status: AC
  Administered 2015-01-31: 4 mg via INTRAVENOUS
  Filled 2015-01-31: qty 2

## 2015-01-31 MED ORDER — PROMETHAZINE HCL 25 MG/ML IJ SOLN
6.2500 mg | INTRAMUSCULAR | Status: DC | PRN
  Administered 2015-01-31: 6.25 mg via INTRAVENOUS
  Filled 2015-01-31: qty 1

## 2015-01-31 MED ORDER — STERILE WATER FOR IRRIGATION IR SOLN
Status: DC | PRN
  Administered 2015-01-31: 1000 mL

## 2015-01-31 MED ORDER — LIDOCAINE HCL (CARDIAC) 20 MG/ML IV SOLN
INTRAVENOUS | Status: AC
  Filled 2015-01-31: qty 5

## 2015-01-31 MED ORDER — NEOSTIGMINE METHYLSULFATE 10 MG/10ML IV SOLN
INTRAVENOUS | Status: AC
  Filled 2015-01-31: qty 1

## 2015-01-31 MED ORDER — MORPHINE SULFATE (PF) 10 MG/ML IV SOLN: 2.0000 mg | INTRAVENOUS | Status: DC | PRN

## 2015-01-31 MED ORDER — SODIUM CHLORIDE 0.9 % IJ SOLN: 3.0000 mL | INTRAMUSCULAR | Status: DC | PRN

## 2015-01-31 MED ORDER — GLYCOPYRROLATE 0.2 MG/ML IJ SOLN
INTRAMUSCULAR | Status: AC
  Filled 2015-01-31: qty 3

## 2015-01-31 MED ORDER — OXYCODONE-ACETAMINOPHEN 10-325 MG PO TABS: 1.0000 | ORAL_TABLET | ORAL | Status: DC | PRN

## 2015-01-31 MED ORDER — HYDROMORPHONE HCL 1 MG/ML IJ SOLN
0.2500 mg | INTRAMUSCULAR | Status: DC | PRN
  Administered 2015-01-31 (×4): 0.5 mg via INTRAVENOUS

## 2015-01-31 MED ORDER — CEFAZOLIN SODIUM-DEXTROSE 2-3 GM-% IV SOLR
INTRAVENOUS | Status: DC | PRN
  Administered 2015-01-31: 2 g via INTRAVENOUS

## 2015-01-31 MED ORDER — MIDAZOLAM HCL 2 MG/2ML IJ SOLN
INTRAMUSCULAR | Status: AC
  Filled 2015-01-31: qty 2

## 2015-01-31 MED ORDER — ACETAMINOPHEN 650 MG RE SUPP
650.0000 mg | RECTAL | Status: DC | PRN
  Filled 2015-01-31: qty 1

## 2015-01-31 MED ORDER — ROCURONIUM BROMIDE 100 MG/10ML IV SOLN
INTRAVENOUS | Status: AC
  Filled 2015-01-31: qty 1

## 2015-01-31 MED ORDER — DEXAMETHASONE SODIUM PHOSPHATE 10 MG/ML IJ SOLN
INTRAMUSCULAR | Status: DC | PRN
  Administered 2015-01-31: 10 mg via INTRAVENOUS

## 2015-01-31 MED ORDER — ONDANSETRON HCL 4 MG/2ML IJ SOLN
INTRAMUSCULAR | Status: AC
  Filled 2015-01-31: qty 2

## 2015-01-31 MED ORDER — LACTATED RINGERS IR SOLN
Status: DC | PRN
  Administered 2015-01-31: 1

## 2015-01-31 MED ORDER — GLYCOPYRROLATE 0.2 MG/ML IJ SOLN
INTRAMUSCULAR | Status: AC
  Filled 2015-01-31: qty 2

## 2015-01-31 MED ORDER — ACETAMINOPHEN 325 MG PO TABS: 650.0000 mg | ORAL_TABLET | ORAL | Status: DC | PRN

## 2015-01-31 MED ORDER — BUPIVACAINE HCL 0.25 % IJ SOLN
INTRAMUSCULAR | Status: DC | PRN
  Administered 2015-01-31: 10 mL

## 2015-01-31 MED ORDER — SODIUM CHLORIDE 0.9 % IV SOLN: 250.0000 mL | INTRAVENOUS | Status: DC | PRN

## 2015-01-31 MED ORDER — ACETAMINOPHEN 500 MG PO TABS
1000.0000 mg | ORAL_TABLET | Freq: Four times a day (QID) | ORAL | Status: DC
  Filled 2015-01-31 (×4): qty 2

## 2015-01-31 MED ORDER — FENTANYL CITRATE (PF) 250 MCG/5ML IJ SOLN
INTRAMUSCULAR | Status: AC
  Filled 2015-01-31: qty 5

## 2015-01-31 MED ORDER — PROPOFOL 10 MG/ML IV BOLUS
INTRAVENOUS | Status: AC
Start: 2015-01-31 — End: 2015-01-31
  Filled 2015-01-31: qty 20

## 2015-01-31 MED ORDER — BUPIVACAINE HCL (PF) 0.25 % IJ SOLN
INTRAMUSCULAR | Status: AC
  Filled 2015-01-31: qty 30

## 2015-01-31 MED ORDER — OXYCODONE HCL 5 MG PO TABS: 5.0000 mg | ORAL_TABLET | ORAL | Status: DC | PRN

## 2015-01-31 MED ORDER — MIDAZOLAM HCL 5 MG/5ML IJ SOLN
INTRAMUSCULAR | Status: DC | PRN
  Administered 2015-01-31: 2 mg via INTRAVENOUS

## 2015-01-31 MED ORDER — LIDOCAINE HCL (CARDIAC) 20 MG/ML IV SOLN
INTRAVENOUS | Status: DC | PRN
  Administered 2015-01-31: 80 mg via INTRAVENOUS

## 2015-01-31 MED ORDER — HYDROMORPHONE HCL 2 MG/ML IJ SOLN
INTRAMUSCULAR | Status: AC
  Filled 2015-01-31: qty 1

## 2015-01-31 SURGICAL SUPPLY — 42 items
CABLE HIGH FREQUENCY MONO STRZ (ELECTRODE) ×2 IMPLANT
CHLORAPREP W/TINT 26ML (MISCELLANEOUS) ×4 IMPLANT
CORDS BIPOLAR (ELECTRODE) ×2 IMPLANT
COVER SURGICAL LIGHT HANDLE (MISCELLANEOUS) ×2 IMPLANT
COVER TIP SHEARS 8 DVNC (MISCELLANEOUS) ×1 IMPLANT
COVER TIP SHEARS 8MM DA VINCI (MISCELLANEOUS) ×1
DRAPE SHEET LG 3/4 BI-LAMINATE (DRAPES) ×4 IMPLANT
DRAPE SURG IRRIG POUCH 19X23 (DRAPES) ×2 IMPLANT
DRAPE TABLE BACK 44X90 PK DISP (DRAPES) ×4 IMPLANT
DRAPE WARM FLUID 44X44 (DRAPE) ×2 IMPLANT
ELECT REM PT RETURN 9FT ADLT (ELECTROSURGICAL) ×2
ELECTRODE REM PT RTRN 9FT ADLT (ELECTROSURGICAL) ×1 IMPLANT
GLOVE BIO SURGEON STRL SZ 6 (GLOVE) ×8 IMPLANT
GLOVE BIO SURGEON STRL SZ 6.5 (GLOVE) ×4 IMPLANT
GOWN STRL REUS W/ TWL LRG LVL3 (GOWN DISPOSABLE) ×3 IMPLANT
GOWN STRL REUS W/TWL LRG LVL3 (GOWN DISPOSABLE) ×3
HOLDER FOLEY CATH W/STRAP (MISCELLANEOUS) ×2 IMPLANT
KIT BASIN OR (CUSTOM PROCEDURE TRAY) ×2 IMPLANT
LIQUID BAND (GAUZE/BANDAGES/DRESSINGS) ×2 IMPLANT
MANIPULATOR UTERINE 4.5 ZUMI (MISCELLANEOUS) IMPLANT
OCCLUDER COLPOPNEUMO (BALLOONS) IMPLANT
PAD POSITIONING PINK XL (MISCELLANEOUS) ×2 IMPLANT
PEN SKIN MARKING BROAD (MISCELLANEOUS) ×2 IMPLANT
PORT ACCESS TROCAR AIRSEAL 12 (TROCAR) IMPLANT
PORT ACCESS TROCAR AIRSEAL 5M (TROCAR)
POUCH SPECIMEN RETRIEVAL 10MM (ENDOMECHANICALS) ×2 IMPLANT
SET TRI-LUMEN FLTR TB AIRSEAL (TUBING) ×2 IMPLANT
SET TUBE IRRIG SUCTION NO TIP (IRRIGATION / IRRIGATOR) ×2 IMPLANT
SHEET LAVH (DRAPES) ×2 IMPLANT
SOLUTION ELECTROLUBE (MISCELLANEOUS) ×2 IMPLANT
SUT VIC AB 0 CT1 27 (SUTURE) ×1
SUT VIC AB 0 CT1 27XBRD ANTBC (SUTURE) ×1 IMPLANT
SUT VIC AB 4-0 PS2 27 (SUTURE) ×4 IMPLANT
SYR 50ML LL SCALE MARK (SYRINGE) IMPLANT
TOWEL OR 17X26 10 PK STRL BLUE (TOWEL DISPOSABLE) ×4 IMPLANT
TOWEL OR NON WOVEN STRL DISP B (DISPOSABLE) ×2 IMPLANT
TRAP SPECIMEN MUCOUS 40CC (MISCELLANEOUS) IMPLANT
TRAY FOLEY W/METER SILVER 14FR (SET/KITS/TRAYS/PACK) ×2 IMPLANT
TRAY LAPAROSCOPIC (CUSTOM PROCEDURE TRAY) ×2 IMPLANT
TROCAR 12M 150ML BLUNT (TROCAR) ×2 IMPLANT
TROCAR BLADELESS OPT 5 100 (ENDOMECHANICALS) ×2 IMPLANT
WATER STERILE IRR 1500ML POUR (IV SOLUTION) ×2 IMPLANT

## 2015-01-31 NOTE — Progress Notes (Signed)
Continues to have nausea after Zofran IV. Pt discharged after nausea relieved by 6.25mg  IV phenergan. Daughter and husband present.

## 2015-01-31 NOTE — Discharge Instructions (Signed)
° ° °Bilateral Salpingo-Oophorectomy, Care After °Refer to this sheet in the next few weeks. These instructions provide you with information on caring for yourself after your procedure. Your health care provider may also give you more specific instructions. Your treatment has been planned according to current medical practices, but problems sometimes occur. Call your health care provider if you have any problems or questions after your procedure. °WHAT TO EXPECT AFTER THE PROCEDURE °After your procedure, it is typical to have the following:  °· Abdominal pain that can be controlled with medicine. °· Vaginal spotting. °· Constipation. °· Menopausal symptoms such as hot flashes, vaginal dryness, and mood swings. °HOME CARE INSTRUCTIONS  °· Get plenty of rest and sleep. °· Only take over-the-counter or prescription medicines as directed by your health care provider. Do not take aspirin. It can cause bleeding. °· Keep incision areas clean and dry. Remove or change bandages (dressings) only as directed by your health care provider. °· Take showers instead of baths for a few weeks as directed by your health care provider. °· Limit exercise and activities as directed by your health care provider. Do not lift anything heavier than 5 pounds (2.3 kg) until your health care provider approves. °· Do not drive until your health care provider approves. °· Follow your health care provider's advice regarding diet. You may be able to resume your usual diet right away. °· Drink enough fluids to keep your urine clear or pale yellow. °· Do not douche, use tampons, or have sexual intercourse for 6 weeks after the procedure. °· Do not drink alcohol until your health care provider says it is okay. °· Take your temperature twice a day and write it down. °· If you become constipated, you may: °¨ Ask your health care provider about taking a mild laxative. °¨ Add more fruit and bran to your diet. °¨ Drink more fluids. °· Follow up with your  health care provider as directed. °SEEK MEDICAL CARE IF:  °· You have swelling, redness, or increasing pain in the incision area. °· You see pus coming from the incision area. °· You notice a bad smell coming from the wound or dressing. °· You have pain, redness, or swelling where the IV access tube was placed. °· Your incision is breaking open (the edges are not staying together). °· You feel dizzy or feel like fainting. °· You develop pain or bleeding when you urinate. °· You develop diarrhea. °· You develop nausea and vomiting. °· You develop abnormal vaginal discharge. °· You develop a rash. °· You have pain that is not controlled with medicine. °SEEK IMMEDIATE MEDICAL CARE IF:  °· You develop a fever. °· You develop abdominal pain. °· You have chest pain. °· You develop shortness of breath. °· You pass out. °· You develop pain, swelling, or redness in your leg. °· You develop heavy vaginal bleeding with or without blood clots. °  °This information is not intended to replace advice given to you by your health care provider. Make sure you discuss any questions you have with your health care provider. °  °Document Released: 02/04/2005 Document Revised: 10/07/2012 Document Reviewed: 07/29/2012 °Elsevier Interactive Patient Education ©2016 [employer] °General Anesthesia, Adult, Care After °Refer to this sheet in the next few weeks. These instructions provide you with information on caring for yourself after your procedure. Your health care provider may also give you more specific instructions. Your treatment has been planned according to current medical practices, but problems sometimes occur. Call   your health care provider if you have any problems or questions after your procedure. °WHAT TO EXPECT AFTER THE PROCEDURE °After the procedure, it is typical to experience: °· Sleepiness. °· Nausea and vomiting. °HOME CARE INSTRUCTIONS °· For the first 24 hours after general anesthesia: °¨ Have a responsible person  with you. °¨ Do not drive a car. If you are alone, do not take public transportation. °¨ Do not drink alcohol. °¨ Do not take medicine that has not been prescribed by your health care provider. °¨ Do not sign important papers or make important decisions. °¨ You may resume a normal diet and activities as directed by your health care provider. °· Change bandages (dressings) as directed. °· If you have questions or problems that seem related to general anesthesia, call the hospital and ask for the anesthetist or anesthesiologist on call. °SEEK MEDICAL CARE IF: °· You have nausea and vomiting that continue the day after anesthesia. °· You develop a rash. °SEEK IMMEDIATE MEDICAL CARE IF:  °· You have difficulty breathing. °· You have chest pain. °· You have any allergic problems. °  °This information is not intended to replace advice given to you by your health care provider. Make sure you discuss any questions you have with your health care provider. °  °Document Released: 05/13/2000 Document Revised: 02/25/2014 Document Reviewed: 06/05/2011 °Elsevier Interactive Patient Education ©2016 Elsevier Inc. ° °

## 2015-01-31 NOTE — Anesthesia Procedure Notes (Signed)
Procedure Name: Intubation Date/Time: 01/31/2015 11:01 AM Performed by: Epimenio SarinJARVELA, Paislie Tessler R Pre-anesthesia Checklist: Patient identified, Emergency Drugs available, Suction available, Patient being monitored and Timeout performed Patient Re-evaluated:Patient Re-evaluated prior to inductionOxygen Delivery Method: Circle system utilized Preoxygenation: Pre-oxygenation with 100% oxygen Intubation Type: IV induction Ventilation: Mask ventilation without difficulty Laryngoscope Size: Mac and 3 Grade View: Grade II Tube type: Oral Tube size: 6.5 mm Number of attempts: 1 Airway Equipment and Method: Stylet Placement Confirmation: ETT inserted through vocal cords under direct vision,  positive ETCO2 and breath sounds checked- equal and bilateral Secured at: 20 cm Tube secured with: Tape Dental Injury: Teeth and Oropharynx as per pre-operative assessment  Comments: Bottom of cords visible with cricoid pressure and head elevated. Lubricated ETT easily passed through cords.

## 2015-01-31 NOTE — Anesthesia Postprocedure Evaluation (Signed)
Anesthesia Post Note  Patient: [name]  Procedure(s) Performed: Procedure(s) (LRB): ROBOTIC ASSISTED BILATERAL SALPINGO OOPHORECTOMY AND POSSIBLE LYSIS OF ADHESIONS (Bilateral)  Patient location during evaluation: PACU Anesthesia Type: General Level of consciousness: awake and alert Pain management: pain level controlled Vital Signs Assessment: post-procedure vital signs reviewed and stable Respiratory status: spontaneous breathing, nonlabored ventilation and respiratory function stable Cardiovascular status: blood pressure returned to baseline and stable Postop Assessment: no signs of nausea or vomiting Anesthetic complications: no    Last Vitals:  Filed Vitals:   01/31/15 1315 01/31/15 1330  BP: 152/76 141/76  Pulse: 55 63  Temp:  37.3 C  Resp: 13 15    Last Pain:  Filed Vitals:   01/31/15 1342  PainSc: 2                  Elby Blackwelder,W. EDMOND

## 2015-01-31 NOTE — H&P (Signed)
CC:  Chief Complaint  Patient presents with  . Right Ovarian Cyst    New Consultation    Assessment/Plan:  Crystal. [name] is a 49 y.o. year old with a bilobed right ovarian cyst, likely diverticulitis, and a normal CA 125.  I performed a history, physical examination, and personally reviewed the patient's imaging films including the imaging films from the CT abdo/pelvis of 12/12/14.  The patient has what is likely a benign right ovarian cystic mass (possibly diilated fallopian tubes). I suspect that these are benign given their appearance on imaging and the normal CA 125 and lack of associated changes consistent with ovarian cancer. However, I do believe they are symptomatic and very tender on exam, and may be co-infected from her diverticulitis.  I am recommending a course of cipro/flagyl x 1 week to cool off any significant pelvic infection. We will follow this with a robotic BSO, and possible lysis of adhesions.  I described to [name] that in the face of recent pelvic infection, there will likely be increased pelvic adhesive disease which increases her risk for having a surgical complication, including damage to visceral or GU organs and for requiring colostomy or bowel resection. I reviewed surgical risks including bleeding, infection, damage to internal organs (such as bladder,ureters, bowels), blood clot, reoperation and rehospitalization.   HPI: [name] is a very pleasant 49 year old G2P2 who is seen in consultation at the request of Dr Arelia Sneddon for ovarian cysts on [place] and CT.  The patient has a history of pelvic pain (left sided) in August2016 and change in bowel habit. She was presumed to have colitis and treated with a course of cipro. This improved her symptoms and they resolved. However she developed acute worsening of the pain again in October 2016. This prompted a CT abdo/pelvis on 12/12/14 (the patient brought these images to her consultation at the office  and I reviewed them from the disc). This showed an aggregate cystic mass of 9.5x4.9cm (bilobed). It contained thick walls but no solid component.   A pelvic US was performed by Dr Arelia Sneddon on 12/13/14 which showed no left ovary, and on the right ovary a 4.9x4.4cm and 4.2x4.6cm right ovarian thick walled simple cysts with no flow in the cysts.   She reports that her pain comes and goes but is always there somewhat. It wakes her up at night. She is unclear of relieving factors other than it is somewhat improved after BM's.  CA 125 from 12/13/14 was normal at 11U/mL.  She is otherwise fairly healthy. She is overweight (BMI 29) and has HTN and hypercholesterolemia. She has had no abdominal surgeries but has a history of a vaginal hysterectomy for menorrhagia. She also had "bladder tacking" at that time.   Current Meds:  Outpatient Encounter Prescriptions as of 12/15/2014  Medication Sig  . fexofenadine (ALLEGRA) 180 MG tablet Take 1 tablet (180 mg total) by mouth daily.  . fluticasone (FLONASE) 50 MCG/ACT nasal spray PLACE 2 SPRAYS INTO BOTH NOSTRILS DAILY.  Marland Kitchen losartan (COZAAR) 50 MG tablet Take 1 tablet (50 mg total) by mouth daily.  . metoprolol (LOPRESSOR) 50 MG tablet Take 1 tablet (50 mg total) by mouth 2 (two) times daily.  Marland Kitchen oxyCODONE-acetaminophen (PERCOCET/ROXICET) 5-325 MG tablet Take 1-2 tablets by mouth every 6 (six) hours as needed for severe pain.  . pravastatin (PRAVACHOL) 20 MG tablet TAKE 1 TABLET (20 MG TOTAL) BY MOUTH AT BEDTIME.  . ciprofloxacin (CIPRO) 250 MG tablet Take 1  tablet (250 mg total) by mouth 2 (two) times daily.  . metroNIDAZOLE (FLAGYL) 500 MG tablet Take 1 tablet (500 mg total) by mouth 3 (three) times daily.  . nitroGLYCERIN (NITROSTAT) 0.4 MG SL tablet Place 1 tablet (0.4 mg total) under the tongue every 5 (five) minutes as needed for chest pain. (Patient not taking: Reported on 12/15/2014)  . [DISCONTINUED] albuterol (PROVENTIL  HFA;VENTOLIN HFA) 108 (90 BASE) MCG/ACT inhaler Inhale 2 puffs into the lungs every 6 (six) hours as needed for wheezing or shortness of breath. (Patient not taking: Reported on 12/15/2014)  . [DISCONTINUED] citalopram (CELEXA) 20 MG tablet TAKE 1 TABLET (20 MG TOTAL) BY MOUTH DAILY. (Patient not taking: Reported on 12/12/2014)   No facility-administered encounter medications on file as of 12/15/2014.    Allergy:  Allergies  Allergen Reactions  . Niacin And Related Other (See Comments)    Passes out    Social Hx:  Social History   Social History  . Marital Status: Married    Spouse Name: N/A  . Number of Children: 2  . Years of Education: N/A   Occupational History  . office manager    Social History Main Topics  . Smoking status: Never Smoker   . Smokeless tobacco: Never Used  . Alcohol Use: No  . Drug Use: No  . Sexual Activity: Yes   Other Topics Concern  . Not on file   Social History Narrative    Past Surgical Hx:  Past Surgical History  Procedure Laterality Date  . Tubal ligation      1992  . Vaginal hysterectomy      partial 1995  . Tonsillectomy      age 89  . Adenoidectomy      age 89  . Cleft palate repair      infant  . Other surgical history  05/2014, 09/2014    Tracheal bronchoscopy and dilation - [facility]    Past Medical Hx:  Past Medical History  Diagnosis Date  . Metabolic syndrome   . Hyperlipidemia   . Hypertension   . Tracheal stenosis     Past Gynecological History: G2P2 SVD x 2 No LMP recorded. Patient has had a hysterectomy.  Family Hx:  Family History  Problem Relation Age of Onset  . Diabetes Father   . Vasculitis Father   . Heart disease Paternal Grandfather   . Lung cancer Maternal Grandmother     Review of Systems:  Constitutional   Feels well,  ENT Normal appearing ears and nares bilaterally Skin/Breast  No rash, sores, jaundice, itching, dryness Cardiovascular  No chest pain, shortness of breath, or edema  Pulmonary  No cough or wheeze.  Gastro Intestinal  No nausea, vomitting, or diarrhoea. No bright red blood per rectum, + abdominal pain, No change in bowel movement, or constipation.  Genito Urinary  No frequency, urgency, dysuria, no vaginal bleeding Musculo Skeletal  No myalgia, arthralgia, joint swelling or pain  Neurologic  No weakness, numbness, change in gait,  Psychology  No depression, anxiety, insomnia.   Vitals: Blood pressure 131/82, pulse 88, temperature 98.9 F (37.2 C), temperature source Oral, resp. rate 16, height 5' 3.25" (1.607 m), weight 165 lb 1.6 oz (74.889 kg).  Physical Exam: WD in NAD Neck  Supple NROM, without any enlargements.  Lymph Node Survey No cervical supraclavicular or inguinal adenopathy Cardiovascular  Pulse normal rate, regularity and rhythm. S1 and S2 normal.  Lungs  Clear to auscultation  bilateraly, without wheezes/crackles/rhonchi. Good air movement.  Skin  No rash/lesions/breakdown  Psychiatry  Alert and oriented to person, place, and time  Abdomen  Normoactive bowel sounds, abdomen soft, and overweight without evidence of hernia. Tender in lower abdomen Back No CVA tenderness Genito Urinary  Vulva/vagina: Normal external female genitalia. No lesions. No discharge or bleeding. Bladder/urethra: No lesions or masses, well supported bladder Vagina: very tender at top of vagina. Appreciate cystic fullness at top of cuff. Cervix: surgically absent Uterus: surgically absent  Adnexa: fullness, rigid, firm 10 central mass. Rectal  Good tone, no masses no cul de sac nodularity. Mass does not feel fixed to rectum Extremities  No bilateral cyanosis, clubbing or  edema.       Quinn Axe, MD

## 2015-01-31 NOTE — Transfer of Care (Signed)
Immediate Anesthesia Transfer of Care Note  Patient: [name]  Procedure(s) Performed: Procedure(s): ROBOTIC ASSISTED BILATERAL SALPINGO OOPHORECTOMY AND POSSIBLE LYSIS OF ADHESIONS (Bilateral)  Patient Location: PACU  Anesthesia Type:General  Level of Consciousness:  sedated, patient cooperative and responds to stimulation  Airway & Oxygen Therapy:Patient Spontanous Breathing and Patient connected to face mask oxgen  Post-op Assessment:  Report given to PACU RN and Post -op Vital signs reviewed and stable  Post vital signs:  Reviewed and stable  Last Vitals: There were no vitals filed for this visit.  Complications: No apparent anesthesia complications

## 2015-01-31 NOTE — Op Note (Signed)
OPERATIVE NOTE  Date: 01/31/15  Preoperative Diagnosis: ovarian mass   Postoperative Diagnosis:  Same, and left ovarian torsion with benign ovarian mass, normal right ovary  Procedure(s) Performed: Robotic-assisted laparoscopic bilateral salpingo-oophorectomy and lysis of adhesions  Surgeon: Adolphus BirchwoodEmma Florina Glas, M.D.  Assistant Surgeon: Antionette CharLisa Jackson-Moore M.D. (an MD assistant was necessary for tissue manipulation, management of robotic instrumentation, retraction and positioning due to the complexity of the case and hospital policies).   Anesthesia: Gen. endotracheal.  Specimens: Bilateral ovaries, fallopian tubes, pelvic washings  Estimated Blood Loss: <20 mL. Blood Replacement: None  Complications: none  Indication for Procedure:  Abdominal pain, 10cm bilobed ovarian/pelvic mass  Operative Findings: 10cm left ovarian necrotic cystic mass. Torsion of left IP ligament. Normal right tube and ovary. Adhesions between sigmoid colon and left ovarian cyst. Benign cyst on frozen section.  Frozen pathology was consistent with benign cystic mass  Procedure: The patient's taken to the operating room and placed under general endotracheal anesthesia testing difficulty. She is placed in a dorsolithotomy position and cervical acromial pad was placed. The arms were tucked with care taken to pad the olecranon process. And prepped and draped in usual sterile fashion. A 5mm incision was made in the left upper quadrant palmer's point and a 5 mm Optiview trocar used to enter the abdomen under direct visualization. With entry into the abdomen and then maintenance of 15 mm of mercury the patient was placed in Trendelenburg position. An incision was made in the umbilicus and a 10mm trochar was placed through this site. Two incisions were made lateral to the umbilical incision in the left and right abdomen measuring 8mm. These incisions were made approximately 10 cm lateral to the umbilical incision. 8 mm robotic  trochars were inserted. The robot was docked.  The abdomen was inspected as was the pelvis.  Pelvic washings were obtained.   Using meticulous sharp dissection, 15 minutes of adhesiolysis took place to free the left ovary from the sigmoid colon adhesions.  An incision was made on the left pelvic side wall peritoneum parallel to the IP ligament and the retroperitoneal space entered. The left ureter was identified and the para-rectal space was developed. A window was created in the right broad ligament above the ureter. The left infundibulopelvic vessels were skeletonized cauterized and transected. The residual utero/vaginal-ovarian ligaments similarly were cauterized and transected. Specimen was placed in an Endo Catch bag.  In a similar manner the right peritoneum and the side wall was incised, and the retroperitoneal space entered. The left ureter was identified and the right pararectal space was developed. The utero-ovarian ligament was skeletonized cauterized and transected. The right ligamentous attachments to the ovary were cauterized and transected in the right adnexa was placed in an Endo Catch bag.  The abdomen was copiously irrigated and drained and all operative sites inspected and hemostasis was assured.  The robot was undocked. The camera was placed through the left upper abdominal incision. The contents of the left Endo Catch bag were first aspirated and then morcellated to facilitate removal from the abdominal cavity through the umbilical incision. In a similar fashion the contents of the right Endo Catch bag or morcellated to facilitate removal from the abdominal cavity.  Frozen section returned as benign.  The ports were all remove. The fascial closure at the umbilical incision and left upper quadrant port was made with 0 Vicryl.  All incisions were closed with a running subcuticular Monocryl suture. Dermabond was applied. Sponge, lap and needle counts were correct x  3.    The  patient had sequential compression devices for VTE prophylaxis.         Disposition: PACU -stable         Condition: stable  Quinn Axe, MD

## 2015-02-01 ENCOUNTER — Encounter (HOSPITAL_COMMUNITY): Payer: Self-pay | Admitting: Gynecologic Oncology

## 2015-02-02 ENCOUNTER — Telehealth: Payer: Self-pay

## 2015-02-02 NOTE — Telephone Encounter (Signed)
[facility] to contact the patient to update her with surgical pathology report was "Benign" and to see how she was feeling. Patient states she continues to have "right" shoulder discomfort , constipation and her "abdomen is "sore" . Patient states she did initiate a stool softener Mila HomerPhillips Milk of Magnesia on Tuesday Dec 13 , 2016 [facility] Jan 30, 2015 , patient instructed to change to Maralax , start with 2 capfuls tonight and increase fluid intake . Patient instructed to call tomorrow if she does not have a BM , patient states understanding .

## 2015-02-07 NOTE — Telephone Encounter (Signed)
Pt called inquiring about if appeal has been done. I called pt back informed her in process of appeal

## 2015-02-08 ENCOUNTER — Telehealth: Payer: Self-pay

## 2015-02-08 NOTE — Telephone Encounter (Signed)
Patient's call returned , patient informed that we wiil update Dr Adolphus BirchwoodEmma Rossi with her request for intervention for "hot flashes" . Patient aware that we will be contacting her tomorrow with Dr Kara MeadEmma Rossi's recommendations. Patient states understanding , denies further questions at this time.

## 2015-02-09 ENCOUNTER — Telehealth: Payer: Self-pay

## 2015-02-09 ENCOUNTER — Encounter: Payer: Self-pay | Admitting: *Deleted

## 2015-02-09 DIAGNOSIS — N838 Other noninflammatory disorders of ovary, fallopian tube and broad ligament: Secondary | ICD-10-CM

## 2015-02-09 MED ORDER — ESTRADIOL 0.5 MG PO TABS: 0.5000 mg | ORAL_TABLET | Freq: Every day | ORAL | Status: DC

## 2015-02-09 NOTE — Telephone Encounter (Signed)
Patient's call returned to update the patient that Dr Adolphus BirchwoodEmma Rossi has approved her request for medication for "hot flashes. Dr Andrey Farmerossi ordered Estradiol 0.5 MG PO Daily , Qty : 30 , 6 Refills . Patient instructed to call in 2-3 weeks a to update us with how she is feeling and if the hot flashes have resolved . Patient states understanding , denies further questions at this time . Patient will call with addiltional changes or concerns.

## 2015-02-09 NOTE — Telephone Encounter (Signed)
I contacted insurance company and they are requesting provider to provide them with a Medical Necessity as to why needed CT scan of abd/pelvis.  The appeals need to have members name and identification number  Name of provider who accessed her care and ordered the test Dates of service (12/12/14) Claim or reference number ([id number]) And specific reason why you do not agree with decision   The letter needs to be sent to [phone]  I tried doing this myself via phone and was told could not do it needs to be written.   I have placed the forms in blue folder

## 2015-02-10 ENCOUNTER — Encounter: Payer: Self-pay | Admitting: Family Medicine

## 2015-02-14 ENCOUNTER — Other Ambulatory Visit: Payer: Self-pay | Admitting: Family Medicine

## 2015-02-14 ENCOUNTER — Telehealth: Payer: Self-pay

## 2015-02-14 NOTE — Telephone Encounter (Signed)
Medication refilled per protocol. 

## 2015-02-14 NOTE — Telephone Encounter (Signed)
Incoming call with questions about incision site from robotic-assisted laparoscopic bilateral salpingo-oophorectomy and lysis of adhesions surgery on 01/31/2015 with Dr Adolphus BirchwoodEmma Cardenas. Patient states she is experiencing some drainage with slight redness at all incisions. Patient states drainage is a "small" amount and she is not experiencing pain or fever at this time . Patient informed that these symptoms are normal and to call with an increase in incision drainage , increased redness and pain and or if she develops a fever. Patient states understanding , denies further questions at this time , will call with additional changes questions or concerns.

## 2015-02-24 ENCOUNTER — Ambulatory Visit: Payer: BLUE CROSS/BLUE SHIELD | Attending: Gynecologic Oncology | Admitting: Gynecologic Oncology

## 2015-02-24 ENCOUNTER — Encounter: Payer: Self-pay | Admitting: Gynecologic Oncology

## 2015-02-24 VITALS — BP 122/69 | HR 66 | Temp 98.2°F | Resp 18 | Ht 63.0 in | Wt 165.3 lb

## 2015-02-24 DIAGNOSIS — N839 Noninflammatory disorder of ovary, fallopian tube and broad ligament, unspecified: Secondary | ICD-10-CM | POA: Insufficient documentation

## 2015-02-24 DIAGNOSIS — N838 Other noninflammatory disorders of ovary, fallopian tube and broad ligament: Secondary | ICD-10-CM

## 2015-02-24 DIAGNOSIS — E8941 Symptomatic postprocedural ovarian failure: Secondary | ICD-10-CM | POA: Diagnosis not present

## 2015-02-24 DIAGNOSIS — N83512 Torsion of left ovary and ovarian pedicle: Secondary | ICD-10-CM

## 2015-02-24 NOTE — Patient Instructions (Signed)
Follow up with Dr Arelia SneddonMcComb once a year for your well woman checks. Recommend taking estradiol for up to 5 years then tapering off and monitor for menopausal symptoms.

## 2015-02-24 NOTE — Progress Notes (Signed)
Postoperative Followup  Assessment:    50 y.o. year old with left ovarian torsion and mass.   S/p robotic assisted BSO on 01/31/15.   Plan: 1) Pathology reports reviewed today 2) Treatment counseling - I discussed the benign nature of her mass. Surgical menopause. Symptoms controlled with estradiol oral. Recommend continuting up to 5 years postop (increased risk for breast cancer thereafter given her current age).  She was given the opportunity to ask questions, which were answered to her satisfaction, and she is agreement with the above mentioned plan of care.  3)  Return to clinic on a prn basis. Recommend follow up with Dr Arelia SneddonMcComb for annual well woman check  HPI:  [name] is a 50 y.o. year old No obstetric history on file. initially seen in consultation on 12/15/14 for a left ovarian mass and abdominal pain.  She then underwent a robotic BSO on 01/31/15 (Surgery was delayed due to tracheal stenosis requiring dilation) without complications.  Her postoperative course was uncomplicated. She was found to have a benign ovarian cyst with left ovarian torsion.  Her final pathology revealed left ovarian benign cyst with torsion.  She is seen today for a postoperative check and to discuss her pathology results and ongoing plan.  Since discharge from the hospital, she is feeling well.  She has improving appetite, normal bowel and bladder function, and pain controlled with minimal PO medication. She initially had menopausal symptoms but these are relieved with oral estradiol. She has no other complaints today.    Review of systems: Constitutional:  She has no weight gain or weight loss. She has no fever or chills. Eyes: No blurred vision Ears, Nose, Mouth, Throat: No dizziness, headaches or changes in hearing. No mouth sores. Cardiovascular: No chest pain, palpitations or edema. Respiratory:  No shortness of breath, wheezing or cough Gastrointestinal: She has normal bowel movements without  diarrhea or constipation. She denies any nausea or vomiting. She denies blood in her stool or heart burn. Genitourinary:  She denies pelvic pain, pelvic pressure or changes in her urinary function. She has no hematuria, dysuria, or incontinence. She has no irregular vaginal bleeding or vaginal discharge Musculoskeletal: Denies muscle weakness or joint pains.  Skin:  She has no skin changes, rashes or itching Neurological:  Denies dizziness or headaches. No neuropathy, no numbness or tingling. Psychiatric:  She denies depression or anxiety. Hematologic/Lymphatic:   No easy bruising or bleeding   Physical Exam: Blood pressure 122/69, pulse 66, temperature 98.2 F (36.8 C), temperature source Oral, resp. rate 18, height 5\' 3"  (1.6 m), weight 165 lb 4.8 oz (74.98 kg), SpO2 100 %. General: Well dressed, well nourished in no apparent distress.   HEENT:  Normocephalic and atraumatic, no lesions.  Extraocular muscles intact. Sclerae anicteric. Pupils equal, round, reactive. No mouth sores or ulcers. Thyroid is normal size, not nodular, midline. Cardiovascular:  Regular rate and rhythm.  No murmurs or rubs. Abdomen:  Soft, nontender, nondistended.  No palpable masses.  No hepatosplenomegaly.  No ascites. Normal bowel sounds.  No hernias.  Incisions are well healed. Genitourinary: Normal EGBUS  Vaginal cuff intact.  No bleeding or discharge.  No cul de sac fullness. Extremities: No cyanosis, clubbing or edema.  No calf tenderness or erythema. No palpable cords. Psychiatric: Mood and affect are appropriate. Neurological: Awake, alert and oriented x 3. Sensation is intact, no neuropathy.  Musculoskeletal: No pain, normal strength and range of motion.  Quinn Axeossi, Antia Rahal Caroline, MD

## 2015-03-05 ENCOUNTER — Other Ambulatory Visit: Payer: Self-pay | Admitting: Family Medicine

## 2015-04-27 DIAGNOSIS — T884XXA Failed or difficult intubation, initial encounter: Secondary | ICD-10-CM | POA: Insufficient documentation

## 2015-05-16 ENCOUNTER — Other Ambulatory Visit: Payer: Self-pay | Admitting: Family Medicine

## 2015-07-31 ENCOUNTER — Other Ambulatory Visit: Payer: Self-pay | Admitting: Family Medicine

## 2015-08-10 ENCOUNTER — Other Ambulatory Visit: Payer: Self-pay | Admitting: *Deleted

## 2015-08-10 MED ORDER — FLUTICASONE PROPIONATE 50 MCG/ACT NA SUSP: NASAL | Status: DC

## 2015-08-10 NOTE — Telephone Encounter (Signed)
Received call from patient.   Requested refill on flonase.   Prescription sent to pharmacy.

## 2015-08-25 ENCOUNTER — Other Ambulatory Visit: Payer: Self-pay | Admitting: Family Medicine

## 2015-11-13 ENCOUNTER — Other Ambulatory Visit: Payer: Self-pay | Admitting: Family Medicine

## 2015-11-20 ENCOUNTER — Other Ambulatory Visit: Payer: Self-pay | Admitting: Family Medicine

## 2015-12-21 ENCOUNTER — Ambulatory Visit
Admission: RE | Admit: 2015-12-21 | Discharge: 2015-12-21 | Disposition: A | Discharge status: Home / Self Care | Payer: BLUE CROSS/BLUE SHIELD | Source: Ambulatory Visit | Attending: Family Medicine | Admitting: Family Medicine

## 2015-12-21 ENCOUNTER — Ambulatory Visit (INDEPENDENT_AMBULATORY_CARE_PROVIDER_SITE_OTHER): Payer: BLUE CROSS/BLUE SHIELD | Admitting: Family Medicine

## 2015-12-21 ENCOUNTER — Encounter: Payer: Self-pay | Admitting: Family Medicine

## 2015-12-21 VITALS — BP 130/76 | HR 76 | Temp 98.9°F | Resp 14 | Ht 63.25 in | Wt 174.0 lb

## 2015-12-21 DIAGNOSIS — Z79890 Hormone replacement therapy (postmenopausal): Secondary | ICD-10-CM | POA: Diagnosis not present

## 2015-12-21 DIAGNOSIS — Z23 Encounter for immunization: Secondary | ICD-10-CM

## 2015-12-21 DIAGNOSIS — M25561 Pain in right knee: Secondary | ICD-10-CM

## 2015-12-21 DIAGNOSIS — I10 Essential (primary) hypertension: Secondary | ICD-10-CM

## 2015-12-21 DIAGNOSIS — E7800 Pure hypercholesterolemia, unspecified: Secondary | ICD-10-CM

## 2015-12-21 LAB — CBC WITH DIFFERENTIAL/PLATELET
Basophils Absolute: 0 cells/uL (ref 0–200)
Basophils Relative: 0 %
EOS ABS: 71 {cells}/uL (ref 15–500)
Eosinophils Relative: 1 %
HEMATOCRIT: 40.5 % (ref 35.0–45.0)
Hemoglobin: 13.4 g/dL (ref 12.0–15.0)
LYMPHS PCT: 41 %
Lymphs Abs: 2911 cells/uL (ref 850–3900)
MCH: 31.5 pg (ref 27.0–33.0)
MCHC: 33.1 g/dL (ref 32.0–36.0)
MCV: 95.1 fL (ref 80.0–100.0)
MONO ABS: 568 {cells}/uL (ref 200–950)
MONOS PCT: 8 %
MPV: 9.5 fL (ref 7.5–12.5)
NEUTROS PCT: 50 %
Neutro Abs: 3550 cells/uL (ref 1500–7800)
Platelets: 275 10*3/uL (ref 140–400)
RBC: 4.26 MIL/uL (ref 3.80–5.10)
RDW: 13.4 % (ref 11.0–15.0)
WBC: 7.1 10*3/uL (ref 3.8–10.8)

## 2015-12-21 MED ORDER — METOPROLOL TARTRATE 50 MG PO TABS: ORAL_TABLET | ORAL | 4 refills | Status: DC

## 2015-12-21 MED ORDER — FEXOFENADINE HCL 180 MG PO TABS: 180.0000 mg | ORAL_TABLET | Freq: Every day | ORAL | 11 refills | Status: AC

## 2015-12-21 MED ORDER — LOSARTAN POTASSIUM 50 MG PO TABS: ORAL_TABLET | ORAL | 4 refills | Status: DC

## 2015-12-21 MED ORDER — ESTRADIOL 0.5 MG PO TABS: 0.5000 mg | ORAL_TABLET | Freq: Every day | ORAL | 6 refills | Status: DC

## 2015-12-21 MED ORDER — PRAVASTATIN SODIUM 20 MG PO TABS: ORAL_TABLET | ORAL | 3 refills | Status: DC

## 2015-12-21 MED ORDER — FLUTICASONE PROPIONATE 50 MCG/ACT NA SUSP: NASAL | 5 refills | Status: DC

## 2015-12-21 NOTE — Progress Notes (Signed)
Subjective:    Patient ID: Irven Coeammy C Perras, female    DOB: Jul 30, 1965, 50 y.o.   MRN: [id number]  HPI8/29/16  patient presents today with several days of worsening left lower quadrant abdominal pain she is tender to palpation left lower quadrant. She has noticed some black tarry stools as well as some blood-tinged stools. She had a colonoscopy within the last year which revealed no cancerous polyps. She denies any fevers chills or weight loss. She denies any nausea or vomiting. She denies any constipation.  At that time, my plan was:  I believe the patient has infectious colitis. Noninfectious colitis would be less likely. Begin Cipro 500 mg by mouth twice a day for 10 days. Use Flagyl 500 mg by mouth twice a day for  10 days. If symptoms are not improving over the next 48 hours I would proceed with a CT scan of the abdomen and pelvis. Given the blood-tinged stool, I would like to check a CBC to rule out significant anemia. He denies any recent travel. She denies any diarrhea  12/12/14 She returns today again complaining of left lower quadrant abdominal pain. She states that the antibiotics never really helped the abdominal pain. The abdominal pain has been intermittent over the last 2 months. It typically comes and goes usually lasting several hours per day but then disappearing for more than a week. However she awoke this morning with very intense left lower quadrant abdominal pain that is unrelenting. She is exquisitely tender to palpation in the left lower quadrant today. She does have diminished bowel sounds bilaterally. She also has some guarding and rebound in the right lower quadrant. An urgent CBC obtained in the office shows a white blood cell count of 6. She is afebrile. She denies any constipation, diarrhea, or blood in her stool. She denies any nausea or vomiting. She denies any hematuria. She denies any vaginal discharge or vaginal bleeding. She denies any exacerbating or alleviating  factors.  AT that time, my plan was: here is a very broad differential diagnosis for acute left lower quadrant abdominal pain. Patient is in distress. She needs an urgent CT scan of the abdomen. I will give her 60 mg of Toradol IM 1  And try to schedule the CT scan immediately. Further plan will be dictated based upon the results of the CT scan. Received Toradol 60 mg IM 1  12/23/14 CT revealed a 9.4 x 5 cm bilobed complex cystic mass in the pelvis adjacent to the right adnexa. Differential diagnosis includes complex ovarian cyst cystadenoma or possibly ovarian malignancy. I called and discussed this with the patient. I gave her Percocet 5/325 one to 2 tablets every 6 hours as needed for pain. I will refer her back to her gynecologist, Dr. Arelia SneddonMcComb, as soon as possible. This likely needs surgical excision and biopsy.  She has since seen her gynecologist. CA-125 was normal which is reassuring. They started her on antibiotics for possible diverticulitis and to calm down any possible pelvic infection prior to surgery.  She is scheduled for surgery on November 17 under the care of Dr. Adolphus BirchwoodEmma Rossi.  I have reviewed their notes.  She has completed 1 week of Cipro and Flagyl. She was running a low-grade fever for the entire week. Her fever just subsided yesterday. The abdominal pain improved dramatically on antibiotics. However she only had a 7 day prescription.  At that time, my plan was: I do believe the complex cyst is causing her pain. However I  cannot explain the fever based only on the cyst. It is possible that she has some type of infectious colitis. I would treat infectious colitis for 14 days particularly given the fact she is about to have abdominal surgery. I want to avoid any type of peritonitis. Therefore I would extend the Cipro and Flagyl for an additional week. If after the surgery she continues to have intermittent lower abdominal pain, I would recommend a GI consultation for repeat  colonoscopy.  12/21/15 Ultimately, the pain improved after resection of the ovary and cyst. Patient states the cyst had caused an infarction in the ovary and has led to subsequent infection and the ovary which was causing her pain. After surgery the pain improved. Her blood pressure today is well controlled. She denies any chest pain shortness of breath or dyspnea on exertion. She is due for fasting lipid panel to monitor her cholesterol. She takes pravastatin 20 mg by mouth daily. She denies any myalgias or right upper quadrant pain. She is due for a flu shot. She scheduled her mammogram. Colonoscopy is up-to-date. She does complain of some pain behind her right knee in both joint lines which is worse with flexion greater than 90 Past Medical History:  Diagnosis Date  . Hyperlipidemia   . Hypertension   . Metabolic syndrome   . PONV (postoperative nausea and vomiting)    nausea even after zofran and decadron  . Tracheal stenosis    Past Surgical History:  Procedure Laterality Date  . ADENOIDECTOMY     age 83  . bladder sling surgery x 2    . CLEFT PALATE REPAIR     infant  . OTHER SURGICAL HISTORY  05/2014, 09/2014   Tracheal bronchoscopy and dilation - [facility] Har[facility]  . ROBOTIC ASSISTED BILATERAL SALPINGO OOPHERECTOMY Bilateral 01/31/2015   Procedure: ROBOTIC ASSISTED BILATERAL SALPINGO OOPHORECTOMY AND POSSIBLE LYSIS OF ADHESIONS;  Surgeon: Adolphus BirchwoodEmma Rossi, MD;  Location: [facility];  Service: Gynecology;  Laterality: Bilateral;  . TONSILLECTOMY     age 83  . trachea laser and dilation x 2     trachea stenosis  . TUBAL LIGATION     1992  . tubes in ears as child    . VAGINAL HYSTERECTOMY     partial 1995   Current Outpatient Prescriptions on File Prior to Visit  Medication Sig Dispense Refill  . estradiol (ESTRACE) 0.5 MG tablet Take 1 tablet (0.5 mg total) by mouth daily. 30 tablet 6  . fexofenadine (ALLEGRA) 180 MG tablet Take 1 tablet (180 mg total) by mouth  daily. 30 tablet 11  . fluticasone (FLONASE) 50 MCG/ACT nasal spray PLACE 2 SPRAYS INTO BOTH NOSTRILS DAILY. 16 g 5  . losartan (COZAAR) 50 MG tablet TAKE 1 TABLET (50 MG TOTAL) BY MOUTH DAILY. 30 tablet 4  . metoprolol (LOPRESSOR) 50 MG tablet TAKE 1 TABLET (50 MG TOTAL) BY MOUTH 2 (TWO) TIMES DAILY. 60 tablet 4  . nitroGLYCERIN (NITROSTAT) 0.4 MG SL tablet Place 1 tablet (0.4 mg total) under the tongue every 5 (five) minutes as needed for chest pain. (Patient not taking: Reported on 02/24/2015) 30 tablet 12  . nitroGLYCERIN (NITROSTAT) 0.4 MG SL tablet Place under the tongue.    . pravastatin (PRAVACHOL) 20 MG tablet TAKE 1 TABLET (20 MG TOTAL) BY MOUTH AT BEDTIME. 30 tablet 0   No current facility-administered medications on file prior to visit.    Allergies  Allergen Reactions  . Niacin And Related Other (  See Comments)    Passes out   Social History   Social History  . Marital status: Married    Spouse name: N/A  . Number of children: 2  . Years of education: N/A   Occupational History  . office manager    Social History Main Topics  . Smoking status: Never Smoker  . Smokeless tobacco: Never Used  . Alcohol use No  . Drug use: No  . Sexual activity: Yes   Other Topics Concern  . Not on file   Social History Narrative  . No narrative on file     Review of Systems  All other systems reviewed and are negative.      Objective:   Physical Exam  Cardiovascular: Normal rate, regular rhythm and normal heart sounds.   No murmur heard. Pulmonary/Chest: Effort normal and breath sounds normal. No respiratory distress. She has no wheezes. She has no rales. She exhibits no tenderness.  Abdominal: Soft. Bowel sounds are normal. She exhibits no distension. There is no tenderness. There is no rebound and no guarding.  Vitals reviewed.         Assessment & Plan:  Essential hypertension - Plan: CBC with Differential/Platelet, COMPLETE METABOLIC PANEL WITH GFR, Lipid  panel  Pure hypercholesterolemia  Right knee pain, unspecified chronicity - Plan: DG Knee Complete 4 Views Right  Ovarian mass - Plan: estradiol (ESTRACE) 0.5 MG tablet  Needs flu shot - Plan: Flu Vaccine QUAD 36+ mos IM  Patient received her flu shot. Blood pressures well controlled. I will check a fasting lipid panel. Goal LDL cholesterol is less than 130. Cancer screening is up-to-date. I believe her knee pain is likely secondary to arthritis. Begin with an x-ray of the right knee.

## 2015-12-22 ENCOUNTER — Encounter: Payer: Self-pay | Admitting: Family Medicine

## 2015-12-22 LAB — COMPLETE METABOLIC PANEL WITH GFR
ALT: 20 U/L (ref 6–29)
AST: 18 U/L (ref 10–35)
Albumin: 4.1 g/dL (ref 3.6–5.1)
Alkaline Phosphatase: 65 U/L (ref 33–130)
BUN: 15 mg/dL (ref 7–25)
CALCIUM: 9 mg/dL (ref 8.6–10.4)
CHLORIDE: 105 mmol/L (ref 98–110)
CO2: 23 mmol/L (ref 20–31)
CREATININE: 0.77 mg/dL (ref 0.50–1.05)
GFR, EST NON AFRICAN AMERICAN: 89 mL/min (ref >=60)
Glucose, Bld: 107 mg/dL — ABNORMAL HIGH (ref 70–99)
Potassium: 4.5 mmol/L (ref 3.5–5.3)
Sodium: 140 mmol/L (ref 135–146)
Total Bilirubin: 0.7 mg/dL (ref 0.2–1.2)
Total Protein: 6.6 g/dL (ref 6.1–8.1)

## 2015-12-22 LAB — LIPID PANEL
CHOL/HDL RATIO: 5.4 ratio — AB (ref <=5.0)
CHOLESTEROL: 168 mg/dL (ref 125–200)
HDL: 31 mg/dL — AB (ref >=46)
LDL Cholesterol: 92 mg/dL (ref <130)
Triglycerides: 227 mg/dL — ABNORMAL HIGH (ref <150)
VLDL: 45 mg/dL — AB (ref <30)

## 2016-01-04 ENCOUNTER — Telehealth: Payer: Self-pay | Admitting: Family Medicine

## 2016-01-04 MED ORDER — PRAVASTATIN SODIUM 20 MG PO TABS: 20.0000 mg | ORAL_TABLET | Freq: Every day | ORAL | 3 refills | Status: DC

## 2016-01-04 NOTE — Telephone Encounter (Signed)
Pt called and states that she was taking Pravastatin 20mg  1/2 tab po qd and since her last labs came back she has increased back to 1 tab po qhs to get her cholesterol back down but would like a new rx called into pharmacy as she will be running out before a month. Med sent to pharm.

## 2016-04-22 ENCOUNTER — Ambulatory Visit (INDEPENDENT_AMBULATORY_CARE_PROVIDER_SITE_OTHER): Payer: BLUE CROSS/BLUE SHIELD | Admitting: Family Medicine

## 2016-04-22 ENCOUNTER — Encounter: Payer: Self-pay | Admitting: Family Medicine

## 2016-04-22 VITALS — BP 170/88 | HR 78 | Temp 98.6°F | Resp 16 | Ht 63.25 in | Wt 168.0 lb

## 2016-04-22 DIAGNOSIS — F418 Other specified anxiety disorders: Secondary | ICD-10-CM | POA: Diagnosis not present

## 2016-04-22 MED ORDER — ALPRAZOLAM 0.5 MG PO TABS: 0.5000 mg | ORAL_TABLET | Freq: Three times a day (TID) | ORAL | 0 refills | Status: DC | PRN

## 2016-04-22 NOTE — Progress Notes (Signed)
Subjective:    Patient ID: [name], female    DOB: 1965/05/28, 51 y.o.   MRN: [id number]  HPI  Patient has been doing well until 2 weeks ago. 2 weeks ago, she scattered that her mother had been scanned out of more than $60,000. She's also concerned that her mother may have dementia. As a result she started having panic attacks characterized by elevated blood pressure, rapid heart rate, fatigue, nausea, and trouble breathing. She experienced one this morning and is just now starting to improve. She denies any chest pain. She denies any shortness of breath at the present time. She denies any exercise-induced angina. Past Medical History:  Diagnosis Date  . Hyperlipidemia   . Hypertension   . Metabolic syndrome   . PONV (postoperative nausea and vomiting)    nausea even after zofran and decadron  . Tracheal stenosis    Past Surgical History:  Procedure Laterality Date  . ADENOIDECTOMY     age 89  . bladder sling surgery x 2    . CLEFT PALATE REPAIR     infant  . OTHER SURGICAL HISTORY  05/2014, 09/2014   Tracheal bronchoscopy and dilation - [facility]  . ROBOTIC ASSISTED BILATERAL SALPINGO OOPHERECTOMY Bilateral 01/31/2015   Procedure: ROBOTIC ASSISTED BILATERAL SALPINGO OOPHORECTOMY AND POSSIBLE LYSIS OF ADHESIONS;  Surgeon: Adolphus BirchwoodEmma Rossi, MD;  Location: [facility];  Service: Gynecology;  Laterality: Bilateral;  . TONSILLECTOMY     age 89  . trachea laser and dilation x 2     trachea stenosis  . TUBAL LIGATION     1992  . tubes in ears as child    . VAGINAL HYSTERECTOMY     partial 1995   Current Outpatient Prescriptions on File Prior to Visit  Medication Sig Dispense Refill  . estradiol (ESTRACE) 0.5 MG tablet Take 1 tablet (0.5 mg total) by mouth daily. 30 tablet 6  . fexofenadine (ALLEGRA) 180 MG tablet Take 1 tablet (180 mg total) by mouth daily. 30 tablet 11  . fluticasone (FLONASE) 50 MCG/ACT nasal spray PLACE 2 SPRAYS INTO BOTH NOSTRILS  DAILY. 16 g 5  . losartan (COZAAR) 50 MG tablet TAKE 1 TABLET (50 MG TOTAL) BY MOUTH DAILY. 30 tablet 4  . metoprolol (LOPRESSOR) 50 MG tablet TAKE 1 TABLET (50 MG TOTAL) BY MOUTH 2 (TWO) TIMES DAILY. 60 tablet 4  . nitroGLYCERIN (NITROSTAT) 0.4 MG SL tablet Place under the tongue.    . pravastatin (PRAVACHOL) 20 MG tablet Take 1 tablet (20 mg total) by mouth daily. 30 tablet 3   No current facility-administered medications on file prior to visit.    Allergies  Allergen Reactions  . Niacin And Related Other (See Comments)    Passes out   Social History   Social History  . Marital status: Married    Spouse name: N/A  . Number of children: 2  . Years of education: N/A   Occupational History  . office manager    Social History Main Topics  . Smoking status: Never Smoker  . Smokeless tobacco: Never Used  . Alcohol use No  . Drug use: No  . Sexual activity: Yes   Other Topics Concern  . Not on file   Social History Narrative  . No narrative on file     Review of Systems  All other systems reviewed and are negative.      Objective:   Physical Exam  Cardiovascular: Normal rate, regular  rhythm and normal heart sounds.   No murmur heard. Pulmonary/Chest: Effort normal and breath sounds normal. No respiratory distress. She has no wheezes. She has no rales. She exhibits no tenderness.  Abdominal: Soft. Bowel sounds are normal. She exhibits no distension. There is no tenderness. There is no rebound and no guarding.  Vitals reviewed.         Assessment & Plan:  Situational anxiety  I believe the patient is having a panic attack. I recommended using Xanax 0.5 mg by mouth every 8 hours when necessary anxiety. Recheck blood pressure next week and if persistently elevated, we will titrate her antihypertensives.

## 2016-05-01 ENCOUNTER — Telehealth: Payer: Self-pay | Admitting: Family Medicine

## 2016-05-01 NOTE — Telephone Encounter (Signed)
Pt called [facility] stating that she is checking her BP and the average is around 125/80. She thinks she needs something for anxiety and she is having to use her xanax a lot more now.

## 2016-05-02 MED ORDER — ESCITALOPRAM OXALATE 10 MG PO TABS: 10.0000 mg | ORAL_TABLET | Freq: Every day | ORAL | 1 refills | Status: DC

## 2016-05-02 NOTE — Telephone Encounter (Signed)
Call placed to patient and patient made aware.  ° °Verbalized understanding.  ° °Prescription sent to pharmacy.  °

## 2016-05-02 NOTE — Telephone Encounter (Signed)
I would suggest lexapro 10 mg poqday and recheck in 1 month.  Takes 4 weeks to take effect.

## 2016-05-15 ENCOUNTER — Telehealth: Payer: Self-pay | Admitting: Family Medicine

## 2016-05-15 NOTE — Telephone Encounter (Signed)
Pt called [facility] stating that she is having bad side effects to the Celexa and she is stopping it - BP dropping, not sleeping well and just don't feel well and she would like something else called in????

## 2016-05-16 MED ORDER — CLONAZEPAM 0.5 MG PO TABS: 0.5000 mg | ORAL_TABLET | Freq: Two times a day (BID) | ORAL | 1 refills | Status: DC | PRN

## 2016-05-16 MED ORDER — SERTRALINE HCL 50 MG PO TABS: 50.0000 mg | ORAL_TABLET | Freq: Every day | ORAL | 3 refills | Status: DC

## 2016-05-16 NOTE — Telephone Encounter (Signed)
Stop xanax.  Start zoloft 50 poqhs. Can use klonopin 0.5 mg pobid PRN

## 2016-05-16 NOTE — Telephone Encounter (Signed)
Pt would like to try the lorazepam - need dose and sig.

## 2016-05-16 NOTE — Telephone Encounter (Signed)
Ok, try zoloft 50 poqhs instead

## 2016-05-16 NOTE — Telephone Encounter (Signed)
Medication called/sent to requested pharmacy and pt aware 

## 2016-06-18 ENCOUNTER — Other Ambulatory Visit: Payer: Self-pay | Admitting: Family Medicine

## 2016-06-25 ENCOUNTER — Other Ambulatory Visit: Payer: Self-pay | Admitting: Family Medicine

## 2016-08-14 ENCOUNTER — Other Ambulatory Visit: Payer: Self-pay | Admitting: Family Medicine

## 2016-08-14 MED ORDER — TETANUS-DIPHTH-ACELL PERTUSSIS 5-2-15.5 LF-MCG/0.5 IM SUSP: 0.5000 mL | Freq: Once | INTRAMUSCULAR | 0 refills | Status: AC

## 2016-09-09 ENCOUNTER — Other Ambulatory Visit: Payer: Self-pay | Admitting: Family Medicine

## 2016-09-11 ENCOUNTER — Other Ambulatory Visit: Payer: Self-pay | Admitting: Family Medicine

## 2016-09-12 ENCOUNTER — Other Ambulatory Visit: Payer: Self-pay | Admitting: Family Medicine

## 2016-10-27 ENCOUNTER — Other Ambulatory Visit: Payer: Self-pay | Admitting: Family Medicine

## 2016-12-06 ENCOUNTER — Other Ambulatory Visit: Payer: Self-pay | Admitting: Family Medicine

## 2016-12-07 ENCOUNTER — Other Ambulatory Visit: Payer: Self-pay | Admitting: Family Medicine

## 2017-01-04 ENCOUNTER — Other Ambulatory Visit: Payer: Self-pay | Admitting: Family Medicine

## 2017-01-16 ENCOUNTER — Other Ambulatory Visit: Payer: Self-pay | Admitting: Family Medicine

## 2017-01-16 MED ORDER — PRAVASTATIN SODIUM 20 MG PO TABS: 20.0000 mg | ORAL_TABLET | Freq: Every day | ORAL | 3 refills | Status: DC

## 2017-02-05 ENCOUNTER — Other Ambulatory Visit: Payer: Self-pay | Admitting: Family Medicine

## 2017-02-08 ENCOUNTER — Other Ambulatory Visit: Payer: Self-pay | Admitting: Family Medicine

## 2017-02-09 ENCOUNTER — Other Ambulatory Visit: Payer: Self-pay | Admitting: Family Medicine

## 2017-03-13 ENCOUNTER — Other Ambulatory Visit: Payer: Self-pay | Admitting: Family Medicine

## 2017-04-01 ENCOUNTER — Other Ambulatory Visit: Payer: Self-pay | Admitting: Family Medicine

## 2017-04-10 ENCOUNTER — Other Ambulatory Visit: Payer: Self-pay | Admitting: Family Medicine

## 2017-05-06 ENCOUNTER — Other Ambulatory Visit: Payer: Self-pay | Admitting: Family Medicine

## 2017-06-07 ENCOUNTER — Other Ambulatory Visit: Payer: Self-pay | Admitting: Family Medicine

## 2017-06-11 LAB — HM MAMMOGRAPHY

## 2017-07-07 ENCOUNTER — Other Ambulatory Visit: Payer: Self-pay | Admitting: Family Medicine

## 2017-08-04 ENCOUNTER — Other Ambulatory Visit: Payer: Self-pay | Admitting: Family Medicine

## 2017-08-05 ENCOUNTER — Other Ambulatory Visit: Payer: Self-pay | Admitting: Family Medicine

## 2017-09-11 ENCOUNTER — Other Ambulatory Visit: Payer: Self-pay | Admitting: Family Medicine

## 2017-10-11 ENCOUNTER — Other Ambulatory Visit: Payer: Self-pay | Admitting: Family Medicine

## 2017-11-04 ENCOUNTER — Ambulatory Visit (INDEPENDENT_AMBULATORY_CARE_PROVIDER_SITE_OTHER): Payer: Managed Care, Other (non HMO) | Admitting: Family Medicine

## 2017-11-04 ENCOUNTER — Encounter: Payer: Self-pay | Admitting: Family Medicine

## 2017-11-04 ENCOUNTER — Other Ambulatory Visit: Payer: Self-pay | Admitting: Family Medicine

## 2017-11-04 VITALS — BP 144/72 | HR 88 | Temp 98.4°F | Resp 16 | Ht 63.25 in | Wt 181.0 lb

## 2017-11-04 DIAGNOSIS — I10 Essential (primary) hypertension: Secondary | ICD-10-CM | POA: Diagnosis not present

## 2017-11-04 DIAGNOSIS — E7800 Pure hypercholesterolemia, unspecified: Secondary | ICD-10-CM | POA: Diagnosis not present

## 2017-11-04 LAB — LIPID PANEL
CHOL/HDL RATIO: 4.1 (calc) (ref <5.0)
Cholesterol: 167 mg/dL (ref <200)
HDL: 41 mg/dL — AB (ref >50)
LDL Cholesterol (Calc): 93 mg/dL (calc)
NON-HDL CHOLESTEROL (CALC): 126 mg/dL (ref <130)
Triglycerides: 250 mg/dL — ABNORMAL HIGH (ref <150)

## 2017-11-04 LAB — COMPLETE METABOLIC PANEL WITH GFR
AG Ratio: 1.8 (calc) (ref 1.0–2.5)
ALBUMIN MSPROF: 4.5 g/dL (ref 3.6–5.1)
ALKALINE PHOSPHATASE (APISO): 66 U/L (ref 33–130)
ALT: 24 U/L (ref 6–29)
AST: 19 U/L (ref 10–35)
BUN: 16 mg/dL (ref 7–25)
CALCIUM: 9.5 mg/dL (ref 8.6–10.4)
CO2: 25 mmol/L (ref 20–32)
CREATININE: 0.63 mg/dL (ref 0.50–1.05)
Chloride: 103 mmol/L (ref 98–110)
GFR, EST NON AFRICAN AMERICAN: 102 mL/min/{1.73_m2} (ref >=60)
GFR, Est African American: 119 mL/min/{1.73_m2} (ref >=60)
GLOBULIN: 2.5 g/dL (ref 1.9–3.7)
Glucose, Bld: 104 mg/dL — ABNORMAL HIGH (ref 65–99)
Potassium: 4.9 mmol/L (ref 3.5–5.3)
SODIUM: 140 mmol/L (ref 135–146)
Total Bilirubin: 0.8 mg/dL (ref 0.2–1.2)
Total Protein: 7 g/dL (ref 6.1–8.1)

## 2017-11-04 LAB — CBC WITH DIFFERENTIAL/PLATELET
BASOS ABS: 48 {cells}/uL (ref 0–200)
Basophils Relative: 0.6 %
Eosinophils Absolute: 56 cells/uL (ref 15–500)
Eosinophils Relative: 0.7 %
HEMATOCRIT: 39 % (ref 35.0–45.0)
HEMOGLOBIN: 13.1 g/dL (ref 11.7–15.5)
LYMPHS ABS: 2856 {cells}/uL (ref 850–3900)
MCH: 31.5 pg (ref 27.0–33.0)
MCHC: 33.6 g/dL (ref 32.0–36.0)
MCV: 93.8 fL (ref 80.0–100.0)
MPV: 9.7 fL (ref 7.5–12.5)
Monocytes Relative: 6.8 %
NEUTROS PCT: 56.2 %
Neutro Abs: 4496 cells/uL (ref 1500–7800)
Platelets: 294 10*3/uL (ref 140–400)
RBC: 4.16 10*6/uL (ref 3.80–5.10)
RDW: 12.1 % (ref 11.0–15.0)
Total Lymphocyte: 35.7 %
WBC: 8 10*3/uL (ref 3.8–10.8)
WBCMIX: 544 {cells}/uL (ref 200–950)

## 2017-11-04 MED ORDER — LOSARTAN POTASSIUM 50 MG PO TABS: 50.0000 mg | ORAL_TABLET | Freq: Two times a day (BID) | ORAL | 3 refills | Status: DC

## 2017-11-04 NOTE — Progress Notes (Signed)
Subjective:    Patient ID: [name], female    DOB: 1965/03/12, 52 y.o.   MRN: [id number]  HPI  Patient is a very pleasant 52 year old Caucasian female here today to follow-up her hypertension and hyperlipidemia.  Since I last saw the patient, she had a new grandson.  She is due today for a flu shot which she politely declines.  She also declines hepatitis C or HIV screening.  Her Pap smear was performed by her gynecologist earlier this year.  Her mammogram is up-to-date.  She denies any chest pain.  She denies any shortness of breath.  She does have some mild dyspnea on exertion but this is due to her tracheal stenosis which is a chronic problem for her and she is due to have this stretched this year.  She denies any myalgias or right upper quadrant pain.  Her blood pressure is mildly elevated and she says that this is in keeping with her typical blood pressure Past Medical History:  Diagnosis Date  . Hyperlipidemia   . Hypertension   . Metabolic syndrome   . PONV (postoperative nausea and vomiting)    nausea even after zofran and decadron  . Tracheal stenosis    Past Surgical History:  Procedure Laterality Date  . ADENOIDECTOMY     age 19  . bladder sling surgery x 2    . CLEFT PALATE REPAIR     infant  . OTHER SURGICAL HISTORY  05/2014, 09/2014   Tracheal bronchoscopy and dilation - performed by Dr. Harriette Ohara at [facility]  . ROBOTIC ASSISTED BILATERAL SALPINGO OOPHERECTOMY Bilateral 01/31/2015   Procedure: ROBOTIC ASSISTED BILATERAL SALPINGO OOPHORECTOMY AND POSSIBLE LYSIS OF ADHESIONS;  Surgeon: Adolphus Birchwood, MD;  Location: [facility];  Service: Gynecology;  Laterality: Bilateral;  . TONSILLECTOMY     age 19  . trachea laser and dilation x 2     trachea stenosis  . TUBAL LIGATION     1992  . tubes in ears as child    . VAGINAL HYSTERECTOMY     partial 1995   Current Outpatient Medications on File Prior to Visit  Medication Sig Dispense Refill  . estradiol (ESTRACE) 0.5 MG  tablet Take 1 tablet (0.5 mg total) by mouth daily. 30 tablet 6  . fexofenadine (ALLEGRA) 180 MG tablet Take 1 tablet (180 mg total) by mouth daily. 30 tablet 11  . fluticasone (FLONASE) 50 MCG/ACT nasal spray SPRAY TWO SPRAYS IN EACH NOSTRIL DAILY 48 g 2  . losartan (COZAAR) 50 MG tablet TAKE ONE TABLET BY MOUTH DAILY 30 tablet 0  . metoprolol tartrate (LOPRESSOR) 50 MG tablet TAKE ONE TABLET BY MOUTH TWICE A DAY 180 tablet 3  . pravastatin (PRAVACHOL) 20 MG tablet TAKE ONE TABLET BY MOUTH DAILY 30 tablet 2  . sertraline (ZOLOFT) 50 MG tablet TAKE ONE TABLET BY MOUTH DAILY 30 tablet 0   No current facility-administered medications on file prior to visit.    Allergies  Allergen Reactions  . Niacin And Related Other (See Comments)    Passes out   Social History   Socioeconomic History  . Marital status: Married    Spouse name: Not on file  . Number of children: 2  . Years of education: Not on file  . Highest education level: Not on file  Occupational History  . Occupation: Print production planner  Social Needs  . Financial resource strain: Not on file  . Food insecurity:    Worry: Not on file  Inability: Not on file  . Transportation needs:    Medical: Not on file    Non-medical: Not on file  Tobacco Use  . Smoking status: Never Smoker  . Smokeless tobacco: Never Used  Substance and Sexual Activity  . Alcohol use: No    Alcohol/week: 0.0 standard drinks  . Drug use: No  . Sexual activity: Yes  Lifestyle  . Physical activity:    Days per week: Not on file    Minutes per session: Not on file  . Stress: Not on file  Relationships  . Social connections:    Talks on phone: Not on file    Gets together: Not on file    Attends religious service: Not on file    Active member of club or organization: Not on file    Attends meetings of clubs or organizations: Not on file    Relationship status: Not on file  . Intimate partner violence:    Fear of current or ex partner: Not on  file    Emotionally abused: Not on file    Physically abused: Not on file    Forced sexual activity: Not on file  Other Topics Concern  . Not on file  Social History Narrative  . Not on file     Review of Systems  All other systems reviewed and are negative.      Objective:   Physical Exam  Cardiovascular: Normal rate, regular rhythm and normal heart sounds.  No murmur heard. Pulmonary/Chest: Effort normal and breath sounds normal. No respiratory distress. She has no wheezes. She has no rales. She exhibits no tenderness.  Abdominal: Soft. Bowel sounds are normal. She exhibits no distension. There is no tenderness. There is no rebound and no guarding.  Vitals reviewed.         Assessment & Plan:  Essential hypertension - Plan: CBC with Differential/Platelet, COMPLETE METABOLIC PANEL WITH GFR, Lipid panel  Pure hypercholesterolemia I would increase the patient's losartan to 50 mg p.o. twice daily and continue metoprolol at its current dose to better manage her blood pressure.  I will check a CBC, CMP, and fasting lipid panel.  I will make no changes in her statin medication unless her ten-year risk of cardiovascular disease necessitates a moderate to high intensity statin.  Recommended a flu shot, but the patient declined

## 2017-11-06 ENCOUNTER — Other Ambulatory Visit: Payer: Self-pay | Admitting: Family Medicine

## 2017-11-06 MED ORDER — LOSARTAN POTASSIUM 50 MG PO TABS: 50.0000 mg | ORAL_TABLET | Freq: Two times a day (BID) | ORAL | 3 refills | Status: DC

## 2017-11-24 ENCOUNTER — Encounter: Payer: Self-pay | Admitting: *Deleted

## 2017-12-01 ENCOUNTER — Encounter: Payer: Self-pay | Admitting: *Deleted

## 2017-12-31 ENCOUNTER — Other Ambulatory Visit: Payer: Self-pay | Admitting: Family Medicine

## 2018-01-30 ENCOUNTER — Other Ambulatory Visit: Payer: Self-pay | Admitting: Family Medicine

## 2018-02-09 ENCOUNTER — Other Ambulatory Visit: Payer: Self-pay | Admitting: Family Medicine

## 2018-02-09 MED ORDER — LOSARTAN POTASSIUM 100 MG PO TABS: 50.0000 mg | ORAL_TABLET | Freq: Two times a day (BID) | ORAL | 3 refills | Status: DC

## 2018-03-01 ENCOUNTER — Other Ambulatory Visit: Payer: Self-pay | Admitting: Family Medicine

## 2018-03-04 DIAGNOSIS — J386 Stenosis of larynx: Secondary | ICD-10-CM | POA: Diagnosis not present

## 2018-03-31 ENCOUNTER — Other Ambulatory Visit: Payer: Self-pay | Admitting: Family Medicine

## 2018-04-01 ENCOUNTER — Other Ambulatory Visit: Payer: Self-pay | Admitting: Family Medicine

## 2018-04-30 ENCOUNTER — Other Ambulatory Visit: Payer: Self-pay | Admitting: Family Medicine

## 2018-05-30 ENCOUNTER — Other Ambulatory Visit: Payer: Self-pay | Admitting: Family Medicine

## 2018-06-16 DIAGNOSIS — Z6832 Body mass index (BMI) 32.0-32.9, adult: Secondary | ICD-10-CM | POA: Diagnosis not present

## 2018-06-16 DIAGNOSIS — Z01419 Encounter for gynecological examination (general) (routine) without abnormal findings: Secondary | ICD-10-CM | POA: Diagnosis not present

## 2018-06-17 ENCOUNTER — Other Ambulatory Visit: Payer: Self-pay | Admitting: Obstetrics and Gynecology

## 2018-06-17 DIAGNOSIS — R928 Other abnormal and inconclusive findings on diagnostic imaging of breast: Secondary | ICD-10-CM

## 2018-06-23 ENCOUNTER — Ambulatory Visit
Admission: RE | Admit: 2018-06-23 | Discharge: 2018-06-23 | Disposition: A | Discharge status: Home / Self Care | Payer: 59 | Source: Ambulatory Visit | Attending: Obstetrics and Gynecology | Admitting: Obstetrics and Gynecology

## 2018-06-23 ENCOUNTER — Other Ambulatory Visit: Payer: Self-pay

## 2018-06-23 ENCOUNTER — Ambulatory Visit
Admission: RE | Admit: 2018-06-23 | Discharge: 2018-06-23 | Disposition: A | Discharge status: Home / Self Care | Payer: BLUE CROSS/BLUE SHIELD | Source: Ambulatory Visit | Attending: Obstetrics and Gynecology | Admitting: Obstetrics and Gynecology

## 2018-06-23 DIAGNOSIS — N6001 Solitary cyst of right breast: Secondary | ICD-10-CM | POA: Diagnosis not present

## 2018-06-23 DIAGNOSIS — R928 Other abnormal and inconclusive findings on diagnostic imaging of breast: Secondary | ICD-10-CM

## 2018-06-29 ENCOUNTER — Other Ambulatory Visit: Payer: Self-pay | Admitting: Family Medicine

## 2018-08-04 ENCOUNTER — Other Ambulatory Visit: Payer: Self-pay | Admitting: Family Medicine

## 2018-08-28 ENCOUNTER — Other Ambulatory Visit: Payer: Self-pay | Admitting: Family Medicine

## 2018-09-27 ENCOUNTER — Other Ambulatory Visit: Payer: Self-pay | Admitting: Family Medicine

## 2018-10-20 ENCOUNTER — Encounter: Payer: Self-pay | Admitting: Family Medicine

## 2018-10-20 ENCOUNTER — Ambulatory Visit: Payer: Managed Care, Other (non HMO) | Admitting: Family Medicine

## 2018-10-20 ENCOUNTER — Other Ambulatory Visit: Payer: Self-pay

## 2018-10-20 VITALS — BP 130/74 | Temp 98.3°F | Resp 14 | Ht 63.25 in | Wt 182.0 lb

## 2018-10-20 DIAGNOSIS — E7800 Pure hypercholesterolemia, unspecified: Secondary | ICD-10-CM

## 2018-10-20 DIAGNOSIS — Z23 Encounter for immunization: Secondary | ICD-10-CM

## 2018-10-20 NOTE — Addendum Note (Signed)
Addended by: Shary Decamp B on: 10/20/2018 09:57 AM   Modules accepted: Orders

## 2018-10-20 NOTE — Progress Notes (Signed)
Subjective:    Patient ID: [name], female    DOB: 08-Feb-1966, 53 y.o.   MRN: [id number]  Medication Refill    Patient is a very pleasant 53 year old Caucasian female here today for hypertension.  Her blood pressures well controlled.  She checks her blood pressure frequently at home and systolic blood pressure is always less than 703 and the diastolic blood pressures always less than 90.  She denies any chest pain, shortness of breath, dyspnea on exertion.  She denies any myalgias or right upper quadrant pain on her pravastatin.  She is due to recheck her fasting blood sugar.  She received her flu shot today as well.  Her mammogram, Pap smear performed at her gynecologist.  Her colonoscopy was performed in 2016 and was good for 10 years.  Unfortunately her mother recently passed away from multiple myeloma. Past Medical History:  Diagnosis Date  . Hyperlipidemia   . Hypertension   . Metabolic syndrome   . PONV (postoperative nausea and vomiting)    nausea even after zofran and decadron  . Tracheal stenosis    Past Surgical History:  Procedure Laterality Date  . ADENOIDECTOMY     age 49  . bladder sling surgery x 2    . CLEFT PALATE REPAIR     infant  . OTHER SURGICAL HISTORY  05/2014, 09/2014   Tracheal bronchoscopy and dilation - performed by Dr. Carol Ada at [facility] Bilateral 01/31/2015   Procedure: ROBOTIC ASSISTED BILATERAL SALPINGO OOPHORECTOMY AND POSSIBLE LYSIS OF ADHESIONS;  Surgeon: Everitt Amber, MD;  Location: [facility];  Service: Gynecology;  Laterality: Bilateral;  . TONSILLECTOMY     age 55  . trachea laser and dilation x 2     trachea stenosis  . TUBAL LIGATION     1992  . tubes in ears as child    . VAGINAL HYSTERECTOMY     partial 1995   Current Outpatient Medications on File Prior to Visit  Medication Sig Dispense Refill  . estradiol (ESTRACE) 0.5 MG tablet Take 1 tablet (0.5 mg total) by mouth daily. 30  tablet 6  . fexofenadine (ALLEGRA) 180 MG tablet Take 1 tablet (180 mg total) by mouth daily. 30 tablet 11  . fluticasone (FLONASE) 50 MCG/ACT nasal spray SPRAY TWO SPRAYS IN EACH NOSTRIL DAILY 16 g 11  . losartan (COZAAR) 100 MG tablet Take 0.5 tablets (50 mg total) by mouth 2 (two) times daily. 90 tablet 3  . losartan (COZAAR) 50 MG tablet Take 1 tablet (50 mg total) by mouth 2 (two) times daily. 180 tablet 3  . metoprolol tartrate (LOPRESSOR) 50 MG tablet TAKE ONE TABLET BY MOUTH TWICE A DAY 60 tablet 4  . pravastatin (PRAVACHOL) 20 MG tablet TAKE ONE TABLET BY MOUTH DAILY 30 tablet 0  . sertraline (ZOLOFT) 50 MG tablet TAKE ONE TABLET BY MOUTH DAILY 30 tablet 0  . sertraline (ZOLOFT) 50 MG tablet TAKE ONE TABLET BY MOUTH DAILY 90 tablet 2   No current facility-administered medications on file prior to visit.    Allergies  Allergen Reactions  . Niacin And Related Other (See Comments)    Passes out   Social History   Socioeconomic History  . Marital status: Married    Spouse name: Not on file  . Number of children: 2  . Years of education: Not on file  . Highest education level: Not on file  Occupational History  . Occupation: office  manager  Social Needs  . Financial resource strain: Not on file  . Food insecurity    Worry: Not on file    Inability: Not on file  . Transportation needs    Medical: Not on file    Non-medical: Not on file  Tobacco Use  . Smoking status: Never Smoker  . Smokeless tobacco: Never Used  Substance and Sexual Activity  . Alcohol use: No    Alcohol/week: 0.0 standard drinks  . Drug use: No  . Sexual activity: Yes  Lifestyle  . Physical activity    Days per week: Not on file    Minutes per session: Not on file  . Stress: Not on file  Relationships  . Social Herbalist on phone: Not on file    Gets together: Not on file    Attends religious service: Not on file    Active member of club or organization: Not on file    Attends  meetings of clubs or organizations: Not on file    Relationship status: Not on file  . Intimate partner violence    Fear of current or ex partner: Not on file    Emotionally abused: Not on file    Physically abused: Not on file    Forced sexual activity: Not on file  Other Topics Concern  . Not on file  Social History Narrative  . Not on file     Review of Systems  All other systems reviewed and are negative.      Objective:   Physical Exam  Cardiovascular: Normal rate, regular rhythm and normal heart sounds.  No murmur heard. Pulmonary/Chest: Effort normal and breath sounds normal. No respiratory distress. She has no wheezes. She has no rales. She exhibits no tenderness.  Abdominal: Soft. Bowel sounds are normal. She exhibits no distension. There is no abdominal tenderness. There is no rebound and no guarding.  Vitals reviewed.         Assessment & Plan:  Pure hypercholesterolemia - Plan: CBC with Differential/Platelet, COMPLETE METABOLIC PANEL WITH GFR, Lipid panel  Blood pressure is excellent.  I will check a CBC, CMP, fasting lipid panel.  Goal LDL cholesterol is less than 100.  Monitor fasting blood sugar.  Patient received her flu shot.  Cancer screening is up-to-date.  I did offer my condolences regarding her mother.

## 2018-10-23 LAB — CBC WITH DIFFERENTIAL/PLATELET
Absolute Monocytes: 583 cells/uL (ref 200–950)
Basophils Absolute: 50 cells/uL (ref 0–200)
Basophils Relative: 0.7 %
Eosinophils Absolute: 79 cells/uL (ref 15–500)
Eosinophils Relative: 1.1 %
HCT: 38.9 % (ref 35.0–45.0)
Hemoglobin: 12.9 g/dL (ref 11.7–15.5)
Lymphs Abs: 2592 cells/uL (ref 850–3900)
MCH: 32 pg (ref 27.0–33.0)
MCHC: 33.2 g/dL (ref 32.0–36.0)
MCV: 96.5 fL (ref 80.0–100.0)
MPV: 9.4 fL (ref 7.5–12.5)
Monocytes Relative: 8.1 %
Neutro Abs: 3895 cells/uL (ref 1500–7800)
Neutrophils Relative %: 54.1 %
Platelets: 271 10*3/uL (ref 140–400)
RBC: 4.03 10*6/uL (ref 3.80–5.10)
RDW: 12.5 % (ref 11.0–15.0)
Total Lymphocyte: 36 %
WBC: 7.2 10*3/uL (ref 3.8–10.8)

## 2018-10-23 LAB — LIPID PANEL
Cholesterol: 159 mg/dL (ref <200)
HDL: 38 mg/dL — ABNORMAL LOW (ref >=50)
LDL Cholesterol (Calc): 90 mg/dL (calc)
Non-HDL Cholesterol (Calc): 121 mg/dL (calc) (ref <130)
Total CHOL/HDL Ratio: 4.2 (calc) (ref <5.0)
Triglycerides: 222 mg/dL — ABNORMAL HIGH (ref <150)

## 2018-10-23 LAB — COMPLETE METABOLIC PANEL WITH GFR
AG Ratio: 1.7 (calc) (ref 1.0–2.5)
ALT: 23 U/L (ref 6–29)
AST: 20 U/L (ref 10–35)
Albumin: 4 g/dL (ref 3.6–5.1)
Alkaline phosphatase (APISO): 51 U/L (ref 37–153)
BUN: 18 mg/dL (ref 7–25)
CO2: 24 mmol/L (ref 20–32)
Calcium: 9.2 mg/dL (ref 8.6–10.4)
Chloride: 105 mmol/L (ref 98–110)
Creat: 0.76 mg/dL (ref 0.50–1.05)
GFR, Est African American: 103 mL/min/{1.73_m2} (ref >=60)
GFR, Est Non African American: 89 mL/min/{1.73_m2} (ref >=60)
Globulin: 2.3 g/dL (calc) (ref 1.9–3.7)
Glucose, Bld: 126 mg/dL — ABNORMAL HIGH (ref 65–99)
Potassium: 4.5 mmol/L (ref 3.5–5.3)
Sodium: 140 mmol/L (ref 135–146)
Total Bilirubin: 0.8 mg/dL (ref 0.2–1.2)
Total Protein: 6.3 g/dL (ref 6.1–8.1)

## 2018-10-23 LAB — TEST AUTHORIZATION

## 2018-10-23 LAB — HEMOGLOBIN A1C W/OUT EAG: Hgb A1c MFr Bld: 6.1 % of total Hgb — ABNORMAL HIGH (ref <5.7)

## 2018-10-27 ENCOUNTER — Other Ambulatory Visit: Payer: Self-pay | Admitting: Family Medicine

## 2018-11-22 ENCOUNTER — Other Ambulatory Visit: Payer: Self-pay | Admitting: Family Medicine

## 2018-11-26 ENCOUNTER — Other Ambulatory Visit: Payer: Self-pay | Admitting: Family Medicine

## 2018-12-26 ENCOUNTER — Other Ambulatory Visit: Payer: Self-pay | Admitting: Family Medicine

## 2019-01-23 ENCOUNTER — Other Ambulatory Visit: Payer: Self-pay | Admitting: Family Medicine

## 2019-01-25 ENCOUNTER — Other Ambulatory Visit: Payer: Self-pay | Admitting: Family Medicine

## 2019-02-20 ENCOUNTER — Other Ambulatory Visit: Payer: Self-pay | Admitting: Family Medicine

## 2019-03-18 ENCOUNTER — Other Ambulatory Visit: Payer: Self-pay | Admitting: Family Medicine

## 2019-03-21 ENCOUNTER — Other Ambulatory Visit: Payer: Self-pay | Admitting: Family Medicine

## 2019-04-19 ENCOUNTER — Other Ambulatory Visit: Payer: Self-pay | Admitting: Family Medicine

## 2019-04-19 ENCOUNTER — Other Ambulatory Visit: Payer: Self-pay | Admitting: *Deleted

## 2019-04-19 MED ORDER — SERTRALINE HCL 50 MG PO TABS: 50.0000 mg | ORAL_TABLET | Freq: Every day | ORAL | 1 refills | Status: DC

## 2019-05-23 ENCOUNTER — Other Ambulatory Visit: Payer: Self-pay | Admitting: Family Medicine

## 2019-08-15 ENCOUNTER — Other Ambulatory Visit: Payer: Self-pay | Admitting: Family Medicine

## 2019-08-16 ENCOUNTER — Other Ambulatory Visit: Payer: Self-pay

## 2019-08-16 MED ORDER — FLUTICASONE PROPIONATE 50 MCG/ACT NA SUSP: 1.0000 | Freq: Every day | NASAL | Status: DC

## 2019-08-16 NOTE — Telephone Encounter (Signed)
Last OV: 10/20/18

## 2019-11-24 ENCOUNTER — Other Ambulatory Visit: Payer: Self-pay

## 2019-11-25 MED ORDER — SERTRALINE HCL 50 MG PO TABS: 50.0000 mg | ORAL_TABLET | Freq: Every day | ORAL | 1 refills | Status: DC

## 2019-12-01 ENCOUNTER — Other Ambulatory Visit: Payer: Self-pay | Admitting: Family Medicine

## 2019-12-01 NOTE — Telephone Encounter (Signed)
Letter sent.

## 2019-12-01 NOTE — Telephone Encounter (Signed)
Left message for pharmacy that pt would need to be seen for an appt before refill.   Tried to call patient to schedule an appt but VM was full and unable to leave message.   Julious Payer, CMA

## 2019-12-01 NOTE — Telephone Encounter (Signed)
Received a fax from [facility] requesting a refill on losartan (COZAAR) 50 MG tablet Sig: take one tablet by mouth twice a day. QTY: 180

## 2019-12-06 ENCOUNTER — Other Ambulatory Visit: Payer: Self-pay

## 2019-12-06 ENCOUNTER — Telehealth: Payer: Self-pay

## 2019-12-06 MED ORDER — LOSARTAN POTASSIUM 50 MG PO TABS: 50.0000 mg | ORAL_TABLET | Freq: Two times a day (BID) | ORAL | 2 refills | Status: DC

## 2019-12-06 NOTE — Telephone Encounter (Signed)
Refill needed on losartan (COZAAR) 50 MG tablet sent to  [facility] [address] [place] - [address]   Pt call back [phone]

## 2019-12-20 ENCOUNTER — Other Ambulatory Visit: Payer: Self-pay

## 2019-12-20 MED ORDER — METOPROLOL TARTRATE 50 MG PO TABS: 50.0000 mg | ORAL_TABLET | Freq: Two times a day (BID) | ORAL | 2 refills | Status: DC

## 2019-12-20 MED ORDER — PRAVASTATIN SODIUM 20 MG PO TABS: 20.0000 mg | ORAL_TABLET | Freq: Every day | ORAL | 2 refills | Status: DC

## 2020-01-19 ENCOUNTER — Other Ambulatory Visit: Payer: Self-pay

## 2020-01-19 ENCOUNTER — Other Ambulatory Visit: Payer: 59

## 2020-01-19 DIAGNOSIS — E8881 Metabolic syndrome: Secondary | ICD-10-CM

## 2020-01-19 DIAGNOSIS — E7800 Pure hypercholesterolemia, unspecified: Secondary | ICD-10-CM

## 2020-01-19 DIAGNOSIS — K5792 Diverticulitis of intestine, part unspecified, without perforation or abscess without bleeding: Secondary | ICD-10-CM

## 2020-01-19 DIAGNOSIS — R739 Hyperglycemia, unspecified: Secondary | ICD-10-CM

## 2020-01-19 LAB — CBC WITH DIFFERENTIAL/PLATELET: Lymphs Abs: 2190 cells/uL (ref 850–3900)

## 2020-01-20 ENCOUNTER — Other Ambulatory Visit: Payer: 59

## 2020-01-20 LAB — COMPLETE METABOLIC PANEL WITH GFR
AG Ratio: 2 (calc) (ref 1.0–2.5)
ALT: 19 U/L (ref 6–29)
AST: 13 U/L (ref 10–35)
Albumin: 4.4 g/dL (ref 3.6–5.1)
Alkaline phosphatase (APISO): 55 U/L (ref 37–153)
BUN: 18 mg/dL (ref 7–25)
CO2: 27 mmol/L (ref 20–32)
Calcium: 9.4 mg/dL (ref 8.6–10.4)
Chloride: 106 mmol/L (ref 98–110)
Creat: 0.65 mg/dL (ref 0.50–1.05)
GFR, Est African American: 115 mL/min/{1.73_m2} (ref >=60)
GFR, Est Non African American: 99 mL/min/{1.73_m2} (ref >=60)
Globulin: 2.2 g/dL (calc) (ref 1.9–3.7)
Glucose, Bld: 120 mg/dL — ABNORMAL HIGH (ref 65–99)
Potassium: 4.5 mmol/L (ref 3.5–5.3)
Sodium: 141 mmol/L (ref 135–146)
Total Bilirubin: 1.1 mg/dL (ref 0.2–1.2)
Total Protein: 6.6 g/dL (ref 6.1–8.1)

## 2020-01-20 LAB — CBC WITH DIFFERENTIAL/PLATELET
Absolute Monocytes: 577 cells/uL (ref 200–950)
Basophils Absolute: 22 cells/uL (ref 0–200)
Basophils Relative: 0.3 %
Eosinophils Absolute: 73 cells/uL (ref 15–500)
Eosinophils Relative: 1 %
HCT: 40.8 % (ref 35.0–45.0)
Hemoglobin: 14.1 g/dL (ref 11.7–15.5)
MCH: 32.6 pg (ref 27.0–33.0)
MCHC: 34.6 g/dL (ref 32.0–36.0)
MCV: 94.2 fL (ref 80.0–100.0)
MPV: 9.7 fL (ref 7.5–12.5)
Monocytes Relative: 7.9 %
Neutro Abs: 4438 cells/uL (ref 1500–7800)
Neutrophils Relative %: 60.8 %
Platelets: 282 10*3/uL (ref 140–400)
RBC: 4.33 10*6/uL (ref 3.80–5.10)
RDW: 12.2 % (ref 11.0–15.0)
Total Lymphocyte: 30 %
WBC: 7.3 10*3/uL (ref 3.8–10.8)

## 2020-01-20 LAB — LIPID PANEL
Cholesterol: 145 mg/dL (ref <200)
HDL: 35 mg/dL — ABNORMAL LOW (ref >=50)
LDL Cholesterol (Calc): 87 mg/dL (calc)
Non-HDL Cholesterol (Calc): 110 mg/dL (calc) (ref <130)
Total CHOL/HDL Ratio: 4.1 (calc) (ref <5.0)
Triglycerides: 132 mg/dL (ref <150)

## 2020-01-20 LAB — HEMOGLOBIN A1C
Hgb A1c MFr Bld: 6 % of total Hgb — ABNORMAL HIGH (ref <5.7)
Mean Plasma Glucose: 126 (calc)
eAG (mmol/L): 7 (calc)

## 2020-01-24 ENCOUNTER — Encounter: Payer: Self-pay | Admitting: Family Medicine

## 2020-01-24 ENCOUNTER — Other Ambulatory Visit: Payer: Self-pay

## 2020-01-24 ENCOUNTER — Ambulatory Visit (INDEPENDENT_AMBULATORY_CARE_PROVIDER_SITE_OTHER): Payer: 59 | Admitting: Family Medicine

## 2020-01-24 VITALS — BP 138/76 | HR 67 | Temp 97.8°F | Resp 15 | Ht 63.0 in | Wt 168.0 lb

## 2020-01-24 DIAGNOSIS — Z0001 Encounter for general adult medical examination with abnormal findings: Secondary | ICD-10-CM

## 2020-01-24 DIAGNOSIS — I10 Essential (primary) hypertension: Secondary | ICD-10-CM

## 2020-01-24 DIAGNOSIS — E8881 Metabolic syndrome: Secondary | ICD-10-CM

## 2020-01-24 DIAGNOSIS — E7800 Pure hypercholesterolemia, unspecified: Secondary | ICD-10-CM | POA: Diagnosis not present

## 2020-01-24 DIAGNOSIS — Z0000 Encounter for general adult medical examination without abnormal findings: Secondary | ICD-10-CM

## 2020-01-24 NOTE — Progress Notes (Signed)
Subjective:    Patient ID: [name], female    DOB: [birth year], 54 y.o.   MRN: [id number]  Patient is a very pleasant 54 year old Caucasian female here today for a physical exam.  Past medical history is significant for hypertension, hyperlipidemia, and metabolic syndrome.  She also has prediabetes.  She sees a gynecologist.  The gynecologist performed there mammogram earlier this year and it was normal.  They also performed a Pap smear which was normal.  Her last colonoscopy was in 2016 which was normal.  She is not due for repeat colonoscopy until 2026.  Therefore her cancer screening is up-to-date.  Her blood pressure today is acceptable at 138/76.  She denies any side effects from her blood pressure medication.  She is already had her flu shot, Covid vaccination, and shingles vaccine.  Therefore her vaccines are up-to-date.  Her most recent lab work is listed below: Lab on 01/19/2020  Component Date Value Ref Range Status  . Hgb A1c MFr Bld 01/19/2020 6.0* <5.7 % of total Hgb Final   Comment: For someone without known diabetes, a hemoglobin  A1c value between 5.7% and 6.4% is consistent with prediabetes and should be confirmed with a  follow-up test. . For someone with known diabetes, a value <7% indicates that their diabetes is well controlled. A1c targets should be individualized based on duration of diabetes, age, comorbid conditions, and other considerations. . This assay result is consistent with an increased risk of diabetes. . Currently, no consensus exists regarding use of hemoglobin A1c for diagnosis of diabetes for children. .   . Mean Plasma Glucose 01/19/2020 126  (calc) Final  . eAG (mmol/L) 01/19/2020 7.0  (calc) Final  . Cholesterol 01/19/2020 145  <200 mg/dL Final  . HDL 46/27/0350 35* > OR = 50 mg/dL Final  . Triglycerides 01/19/2020 132  <150 mg/dL Final  . LDL Cholesterol (Calc) 01/19/2020 87  mg/dL (calc) Final   Comment: Reference range:  <100 . Desirable range <100 mg/dL for primary prevention;   <70 mg/dL for patients with CHD or diabetic patients  with > or = 2 CHD risk factors. Marland Kitchen LDL-C is now calculated using the Martin-Hopkins  calculation, which is a validated novel method providing  better accuracy than the Friedewald equation in the  estimation of LDL-C.  Horald Pollen et al. Lenox Ahr. 0938;182(99): 2061-2068  (http://education.[facility].com/faq/FAQ164)   . Total CHOL/HDL Ratio 01/19/2020 4.1  <3.7 (calc) Final  . Non-HDL Cholesterol (Calc) 01/19/2020 110  <130 mg/dL (calc) Final   Comment: For patients with diabetes plus 1 major ASCVD risk  factor, treating to a non-HDL-C goal of <100 mg/dL  (LDL-C of <16 mg/dL) is considered a therapeutic  option.   . Glucose, Bld 01/19/2020 120* 65 - 99 mg/dL Final   Comment: .            Fasting reference interval . For someone without known diabetes, a glucose value between 100 and 125 mg/dL is consistent with prediabetes and should be confirmed with a follow-up test. .   . BUN 01/19/2020 18  7 - 25 mg/dL Final  . Creat 96/78/9381 0.65  0.50 - 1.05 mg/dL Final   Comment: For patients >28 years of age, the reference limit for Creatinine is approximately 13% higher for people identified as African-American. .   . GFR, Est Non African American 01/19/2020 99  > OR = 60 mL/min/1.51m2 Final  . GFR, Est African American 01/19/2020 115  > OR = 60 mL/min/1.49m2 Final  .  BUN/Creatinine Ratio 01/19/2020 NOT APPLICABLE  6 - 22 (calc) Final  . Sodium 01/19/2020 141  135 - 146 mmol/L Final  . Potassium 01/19/2020 4.5  3.5 - 5.3 mmol/L Final  . Chloride 01/19/2020 106  98 - 110 mmol/L Final  . CO2 01/19/2020 27  20 - 32 mmol/L Final  . Calcium 01/19/2020 9.4  8.6 - 10.4 mg/dL Final  . Total Protein 01/19/2020 6.6  6.1 - 8.1 g/dL Final  . Albumin 93/81/0175 4.4  3.6 - 5.1 g/dL Final  . Globulin 12/12/8525 2.2  1.9 - 3.7 g/dL (calc) Final  . AG Ratio 01/19/2020 2.0  1.0 -  2.5 (calc) Final  . Total Bilirubin 01/19/2020 1.1  0.2 - 1.2 mg/dL Final  . Alkaline phosphatase (APISO) 01/19/2020 55  37 - 153 U/L Final  . AST 01/19/2020 13  10 - 35 U/L Final  . ALT 01/19/2020 19  6 - 29 U/L Final  . WBC 01/19/2020 7.3  3.8 - 10.8 Thousand/uL Final  . RBC 01/19/2020 4.33  3.80 - 5.10 Million/uL Final  . Hemoglobin 01/19/2020 14.1  11.7 - 15.5 g/dL Final  . HCT 78/24/2353 40.8  35 - 45 % Final  . MCV 01/19/2020 94.2  80.0 - 100.0 fL Final  . MCH 01/19/2020 32.6  27.0 - 33.0 pg Final  . MCHC 01/19/2020 34.6  32.0 - 36.0 g/dL Final  . RDW 61/44/3154 12.2  11.0 - 15.0 % Final  . Platelets 01/19/2020 282  140 - 400 Thousand/uL Final  . MPV 01/19/2020 9.7  7.5 - 12.5 fL Final  . Neutro Abs 01/19/2020 4,438  1,500 - 7,800 cells/uL Final  . Lymphs Abs 01/19/2020 2,190  850 - 3,900 cells/uL Final  . Absolute Monocytes 01/19/2020 577  200 - 950 cells/uL Final  . Eosinophils Absolute 01/19/2020 73  15.0 - 500.0 cells/uL Final  . Basophils Absolute 01/19/2020 22  0.0 - 200.0 cells/uL Final  . Neutrophils Relative % 01/19/2020 60.8  % Final  . Total Lymphocyte 01/19/2020 30.0  % Final  . Monocytes Relative 01/19/2020 7.9  % Final  . Eosinophils Relative 01/19/2020 1.0  % Final  . Basophils Relative 01/19/2020 0.3  % Final    Past Medical History:  Diagnosis Date  . Hyperlipidemia   . Hypertension   . Metabolic syndrome   . PONV (postoperative nausea and vomiting)    nausea even after zofran and decadron  . Tracheal stenosis    Past Surgical History:  Procedure Laterality Date  . ADENOIDECTOMY     age 11  . bladder sling surgery x 2    . CLEFT PALATE REPAIR     infant  . OTHER SURGICAL HISTORY  05/2014, 09/2014   Tracheal bronchoscopy and dilation - performed by Dr. Harriette Ohara at [facility]  . ROBOTIC ASSISTED BILATERAL SALPINGO OOPHERECTOMY Bilateral 01/31/2015   Procedure: ROBOTIC ASSISTED BILATERAL SALPINGO OOPHORECTOMY AND POSSIBLE LYSIS OF ADHESIONS;  Surgeon:  Adolphus Birchwood, MD;  Location: [facility];  Service: Gynecology;  Laterality: Bilateral;  . TONSILLECTOMY     age 11  . trachea laser and dilation x 2     trachea stenosis  . TUBAL LIGATION     1992  . tubes in ears as child    . VAGINAL HYSTERECTOMY     partial 1995   Current Outpatient Medications on File Prior to Visit  Medication Sig Dispense Refill  . fexofenadine (ALLEGRA) 180 MG tablet Take 1 tablet (180 mg total) by mouth daily. 30  tablet 11  . fluticasone (FLONASE) 50 MCG/ACT nasal spray SPRAY TWO SPRAYS IN EACH NOSTRIL ONCE DAILY 16 g 6  . losartan (COZAAR) 50 MG tablet Take 1 tablet (50 mg total) by mouth 2 (two) times daily. 180 tablet 2  . metoprolol tartrate (LOPRESSOR) 50 MG tablet Take 1 tablet (50 mg total) by mouth 2 (two) times daily. 60 tablet 2  . pravastatin (PRAVACHOL) 20 MG tablet Take 1 tablet (20 mg total) by mouth daily. 90 tablet 2  . sertraline (ZOLOFT) 50 MG tablet Take 1 tablet (50 mg total) by mouth daily. Please schedule office visit. 90 tablet 1   No current facility-administered medications on file prior to visit.   Allergies  Allergen Reactions  . Niacin And Related Other (See Comments)    Passes out   Social History   Socioeconomic History  . Marital status: Married    Spouse name: Not on file  . Number of children: 2  . Years of education: Not on file  . Highest education level: Not on file  Occupational History  . Occupation: Print production planneroffice manager  Tobacco Use  . Smoking status: Never Smoker  . Smokeless tobacco: Never Used  Substance and Sexual Activity  . Alcohol use: Yes    Alcohol/week: 0.0 standard drinks    Comment: 1-2 times in one year   . Drug use: No  . Sexual activity: Yes  Other Topics Concern  . Not on file  Social History Narrative  . Not on file   Social Determinants of Health   Financial Resource Strain:   . Difficulty of Paying Living Expenses: Not on file  Food Insecurity:   . Worried About Programme researcher, broadcasting/film/videounning Out of Food in the  Last Year: Not on file  . Ran Out of Food in the Last Year: Not on file  Transportation Needs:   . Lack of Transportation (Medical): Not on file  . Lack of Transportation (Non-Medical): Not on file  Physical Activity:   . Days of Exercise per Week: Not on file  . Minutes of Exercise per Session: Not on file  Stress:   . Feeling of Stress : Not on file  Social Connections:   . Frequency of Communication with Friends and Family: Not on file  . Frequency of Social Gatherings with Friends and Family: Not on file  . Attends Religious Services: Not on file  . Active Member of Clubs or Organizations: Not on file  . Attends BankerClub or Organization Meetings: Not on file  . Marital Status: Not on file  Intimate Partner Violence:   . Fear of Current or Ex-Partner: Not on file  . Emotionally Abused: Not on file  . Physically Abused: Not on file  . Sexually Abused: Not on file     Review of Systems  All other systems reviewed and are negative.      Objective:   Physical Exam Vitals reviewed.  Constitutional:      General: She is not in acute distress.    Appearance: Normal appearance. She is normal weight. She is not ill-appearing, toxic-appearing or diaphoretic.  HENT:     Head: Normocephalic and atraumatic.     Right Ear: Tympanic membrane and ear canal normal. There is no impacted cerumen.     Left Ear: Tympanic membrane and ear canal normal. There is no impacted cerumen.     Nose: Nose normal. No congestion or rhinorrhea.     Mouth/Throat:     Mouth: Mucous membranes are moist.  Pharynx: Oropharynx is clear. No oropharyngeal exudate or posterior oropharyngeal erythema.  Eyes:     General: No scleral icterus.       Right eye: No discharge.        Left eye: No discharge.     Extraocular Movements: Extraocular movements intact.     Conjunctiva/sclera: Conjunctivae normal.     Pupils: Pupils are equal, round, and reactive to light.  Neck:     Vascular: No carotid bruit.   Cardiovascular:     Rate and Rhythm: Normal rate and regular rhythm.     Heart sounds: Normal heart sounds. No murmur heard.  No friction rub. No gallop.   Pulmonary:     Effort: Pulmonary effort is normal. No respiratory distress.     Breath sounds: Normal breath sounds. No stridor. No wheezing, rhonchi or rales.  Chest:     Chest wall: No tenderness.  Abdominal:     General: Bowel sounds are normal. There is no distension.     Palpations: Abdomen is soft.     Tenderness: There is no abdominal tenderness. There is no right CVA tenderness, left CVA tenderness, guarding or rebound.     Hernia: No hernia is present.  Musculoskeletal:        General: No swelling, tenderness, deformity or signs of injury.     Cervical back: Normal range of motion and neck supple. No rigidity or tenderness.     Right lower leg: No edema.     Left lower leg: No edema.  Lymphadenopathy:     Cervical: No cervical adenopathy.  Skin:    General: Skin is warm.     Coloration: Skin is not jaundiced or pale.     Findings: No bruising, erythema, lesion or rash.  Neurological:     General: No focal deficit present.     Mental Status: She is alert and oriented to person, place, and time. Mental status is at baseline.     Cranial Nerves: No cranial nerve deficit.     Sensory: No sensory deficit.     Motor: No weakness.     Coordination: Coordination normal.     Gait: Gait normal.     Deep Tendon Reflexes: Reflexes normal.  Psychiatric:        Mood and Affect: Mood normal.        Behavior: Behavior normal.        Thought Content: Thought content normal.        Judgment: Judgment normal.           Assessment & Plan:  Pure hypercholesterolemia  Metabolic syndrome  Primary hypertension  General medical exam  Patient's physical exam today is completely normal.  Labs were significant only for an A1c of 6.0.  Recommended that the patient exercise 30 minutes a day 5 days a week of aerobic exercise in  an effort to help prevent progression to type 2 diabetes mellitus.  Immunizations are up-to-date.  Cancer screening is up-to-date.  Blood pressure is acceptable.  Cholesterol is excellent.  Therefore I would make no additional changes in her medication.  Strongly encouraged a low carbohydrate diet and vigorous aerobic exercise.

## 2020-03-22 ENCOUNTER — Other Ambulatory Visit: Payer: Self-pay

## 2020-03-22 MED ORDER — METOPROLOL TARTRATE 50 MG PO TABS: 50.0000 mg | ORAL_TABLET | Freq: Two times a day (BID) | ORAL | 2 refills | Status: DC

## 2020-06-07 ENCOUNTER — Telehealth: Payer: Self-pay | Admitting: Family Medicine

## 2020-06-07 NOTE — Telephone Encounter (Signed)
[facility] from [facility] requesting a refill on fluticasone (FLONASE) 50 MCG/ACT nasal spray

## 2020-06-09 MED ORDER — FLUTICASONE PROPIONATE 50 MCG/ACT NA SUSP: NASAL | 6 refills | Status: DC

## 2020-06-09 NOTE — Telephone Encounter (Signed)
Prescription sent to pharmacy.

## 2020-06-15 ENCOUNTER — Other Ambulatory Visit: Payer: Self-pay | Admitting: Family Medicine

## 2020-07-10 ENCOUNTER — Other Ambulatory Visit: Payer: Self-pay

## 2020-07-10 ENCOUNTER — Encounter: Payer: Self-pay | Admitting: Nurse Practitioner

## 2020-07-10 ENCOUNTER — Ambulatory Visit (INDEPENDENT_AMBULATORY_CARE_PROVIDER_SITE_OTHER): Payer: Commercial Managed Care - PPO | Admitting: Nurse Practitioner

## 2020-07-10 VITALS — BP 160/80 | HR 84 | Temp 98.1°F | Ht 63.0 in | Wt 177.8 lb

## 2020-07-10 DIAGNOSIS — R197 Diarrhea, unspecified: Secondary | ICD-10-CM | POA: Diagnosis not present

## 2020-07-10 NOTE — Patient Instructions (Signed)

## 2020-07-10 NOTE — Progress Notes (Signed)
Subjective:    Patient ID: [name], female    DOB: [birth year], 55 y.o.   MRN: [id number]  HPI: [name] is a 55 y.o. female presenting for diarrhea.   Chief Complaint  Patient presents with  . GI Problem    Diarrhea since eating at [employer] while out of state, developed upset stomach and lots of gas, sore back and stomach. No vomiting or fever   ABDOMINAL ISSUES Got sick after eating at [employer].  Reports loose bowel movements for the past couple of weeks.  Feeling slimey when she wipes. COVID test: at home negative Duration: weeks Nature: dull ache Location: lower abdomen and lower back in middle  Severity: mild Radiation: no Episode duration: intermittent Frequency: intermittent  Alleviating factors: Immodium Aggravating factors: bending over for a long time Treatments attempted: Immodium Constipation: no Diarrhea: yes Episodes of diarrhea/day: loose - not water 4-5 times daily  Color of BM: clay; had green last week Mucous in the stool: no Heartburn: no Bloating:at first; resolved now Flatulence: yes; more than normal Nausea: no Vomiting: no Melena or hematochezia: no Rash: no Jaundice: no Fever: no Weight loss: no  Change in appetite: no; able to tolerate liquids and solids  Immodium 2 per day - helps with gas and loose stool  Allergies  Allergen Reactions  . Niacin And Related Other (See Comments)    Passes out    Outpatient Encounter Medications as of 07/10/2020  Medication Sig  . fexofenadine (ALLEGRA) 180 MG tablet Take 1 tablet (180 mg total) by mouth daily.  . fluticasone (FLONASE) 50 MCG/ACT nasal spray SPRAY TWO SPRAYS IN EACH NOSTRIL ONCE DAILY  . losartan (COZAAR) 50 MG tablet Take 1 tablet (50 mg total) by mouth 2 (two) times daily.  . metoprolol tartrate (LOPRESSOR) 50 MG tablet TAKE ONE TABLET BY MOUTH TWICE A DAY  . pravastatin (PRAVACHOL) 20 MG tablet Take 1 tablet (20 mg total) by mouth daily.  .  [DISCONTINUED] sertraline (ZOLOFT) 50 MG tablet Take 1 tablet (50 mg total) by mouth daily. Please schedule office visit.   No facility-administered encounter medications on file as of 07/10/2020.    Patient Active Problem List   Diagnosis Date Noted  . Diverticulitis 12/15/2014  . Right ovarian cyst 12/15/2014  . Vocal cord dysfunction 06/13/2014  . Chronic cough 03/14/2014  . DOE (dyspnea on exertion) 03/14/2014  . Chest pain 03/02/2013  . Hyperglycemia 02/25/2013  . Metabolic syndrome   . Hyperlipidemia   . Hypertension   . DYSPEPSIA&OTHER [facility] DISORDERS FUNCTION STOMACH 04/17/2009  . HYPERLIPIDEMIA-MIXED 06/04/2008  . ANXIETY 06/04/2008  . HYPERTENSION, BENIGN 05/23/2008  . Palpitations 05/23/2008    Past Medical History:  Diagnosis Date  . Hyperlipidemia   . Hypertension   . Metabolic syndrome   . PONV (postoperative nausea and vomiting)    nausea even after zofran and decadron  . Tracheal stenosis     Relevant past medical, surgical, family and social history reviewed and updated as indicated. Interim medical history since our last visit reviewed.  Review of Systems Per HPI unless specifically indicated above     Objective:    BP (!) 160/80   Pulse 84   Temp 98.1 F (36.7 C)   Ht 5\' 3"  (1.6 m)   Wt 177 lb 12.8 oz (80.6 kg)   SpO2 99%   BMI 31.50 kg/m   Wt Readings from Last 3 Encounters:  07/10/20 177 lb 12.8 oz (80.6 kg)  01/24/20 168 lb (76.2 kg)  10/20/18 182 lb (82.6 kg)    Physical Exam Vitals and nursing note reviewed.  Constitutional:      General: She is not in acute distress.    Appearance: Normal appearance. She is not toxic-appearing.  HENT:     Head: Normocephalic and atraumatic.  Eyes:     General: No scleral icterus.    Extraocular Movements: Extraocular movements intact.  Cardiovascular:     Rate and Rhythm: Regular rhythm.     Heart sounds: Normal heart sounds.  Pulmonary:     Effort: Pulmonary effort is normal. No respiratory  distress.     Breath sounds: Normal breath sounds. No wheezing, rhonchi or rales.  Abdominal:     General: Abdomen is flat. Bowel sounds are normal. There is no distension.     Palpations: Abdomen is soft. There is no mass.     Tenderness: There is generalized abdominal tenderness. There is no right CVA tenderness or left CVA tenderness.  Musculoskeletal:     Right lower leg: No edema.     Left lower leg: No edema.  Skin:    General: Skin is warm and dry.     Capillary Refill: Capillary refill takes less than 2 seconds.     Coloration: Skin is not jaundiced or pale.     Findings: No erythema.  Neurological:     Mental Status: She is alert and oriented to person, place, and time.  Psychiatric:        Mood and Affect: Mood normal.        Behavior: Behavior normal.        Thought Content: Thought content normal.        Judgment: Judgment normal.       Assessment & Plan:  1. Diarrhea, unspecified type Acute.  Given length of symptoms, question infectious etiology vs. Functional diarrhea.  Will check stool for C. Diff, other infectious cause, and O&P.  If negative and symptoms persist, consider irritable bowel disease and elimination diet of either gluten or dairy and adding in cholestyramine.  If symptoms persist despite these modalities, consider referral to GI.   - C. difficile GDH and Toxin A/B; Future - Salmonella/Shigella Cult, Campy EIA and Shiga Toxin reflex; Future - Ova and parasite examination; Future     Follow up plan: Return if symptoms worsen or fail to improve.

## 2020-07-11 ENCOUNTER — Other Ambulatory Visit: Payer: Self-pay

## 2020-07-11 ENCOUNTER — Other Ambulatory Visit: Payer: Commercial Managed Care - PPO

## 2020-07-11 DIAGNOSIS — R197 Diarrhea, unspecified: Secondary | ICD-10-CM

## 2020-07-12 LAB — C. DIFFICILE GDH AND TOXIN A/B
GDH ANTIGEN: NOT DETECTED
MICRO NUMBER:: 11928259
SPECIMEN QUALITY:: ADEQUATE
TOXIN A AND B: NOT DETECTED

## 2020-07-13 LAB — SALMONELLA/SHIGELLA CULT, CAMPY EIA AND SHIGA TOXIN RFL ECOLI
MICRO NUMBER: 11928068
MICRO NUMBER:: 11928069
MICRO NUMBER:: 11928070
Result:: NOT DETECTED
SHIGA RESULT:: NOT DETECTED
SPECIMEN QUALITY: ADEQUATE
SPECIMEN QUALITY:: ADEQUATE
SPECIMEN QUALITY:: ADEQUATE

## 2020-07-13 LAB — OVA AND PARASITE EXAMINATION
CONCENTRATE RESULT:: NONE SEEN
MICRO NUMBER:: 11928065
SPECIMEN QUALITY:: ADEQUATE
TRICHROME RESULT:: NONE SEEN

## 2020-08-04 ENCOUNTER — Encounter: Payer: Self-pay | Admitting: Family Medicine

## 2020-08-04 ENCOUNTER — Ambulatory Visit (INDEPENDENT_AMBULATORY_CARE_PROVIDER_SITE_OTHER): Payer: Commercial Managed Care - PPO | Admitting: Family Medicine

## 2020-08-04 ENCOUNTER — Other Ambulatory Visit: Payer: Self-pay

## 2020-08-04 VITALS — BP 138/78 | HR 82 | Temp 97.9°F | Resp 16 | Ht 63.0 in | Wt 178.0 lb

## 2020-08-04 DIAGNOSIS — A09 Infectious gastroenteritis and colitis, unspecified: Secondary | ICD-10-CM

## 2020-08-04 MED ORDER — AZITHROMYCIN 500 MG PO TABS: 500.0000 mg | ORAL_TABLET | Freq: Every day | ORAL | 0 refills | Status: DC

## 2020-08-04 NOTE — Progress Notes (Signed)
Subjective:    Patient ID: [name], female    DOB: January 14, 1966, 55 y.o.   MRN: [id number] Patient went on vacation May 6.  She went to a baseball game in [place].  They went to a banquet at the ballpark where she ate a large amount of shellfish.  Immediately after that she developed severe diarrhea.  She was having crampy abdominal pain, gas, bloating.  She is having watery stools 6 or more times a day.  She came and saw my nurse practitioner who performed stool O&P testing which was negative.  She was also checked for C. difficile which was negative.  I do not see a stool culture.  No treatment was prescribed.  The patient took 7 days of Cipro on her own that she had from another situation.  The diarrhea has improved slightly but she continues to have 6 or more bowel movements a day.  It is soft and watery.  She also continues to have severe gas and bloating.  She denies any fevers or chills or blood in her stool  Past Medical History:  Diagnosis Date   Hyperlipidemia    Hypertension    Metabolic syndrome    PONV (postoperative nausea and vomiting)    nausea even after zofran and decadron   Tracheal stenosis    Past Surgical History:  Procedure Laterality Date   ADENOIDECTOMY     age 41   bladder sling surgery x 2     CLEFT PALATE REPAIR     infant   OTHER SURGICAL HISTORY  05/2014, 09/2014   Tracheal bronchoscopy and dilation - performed by Dr. Harriette Ohara at [facility]   ROBOTIC ASSISTED BILATERAL SALPINGO OOPHERECTOMY Bilateral 01/31/2015   Procedure: ROBOTIC ASSISTED BILATERAL SALPINGO OOPHORECTOMY AND POSSIBLE LYSIS OF ADHESIONS;  Surgeon: Adolphus Birchwood, MD;  Location: [facility];  Service: Gynecology;  Laterality: Bilateral;   TONSILLECTOMY     age 41   trachea laser and dilation x 2     trachea stenosis   TUBAL LIGATION     1992   tubes in ears as child     VAGINAL HYSTERECTOMY     partial 1995   Current Outpatient Medications on File Prior to Visit  Medication Sig  Dispense Refill   fexofenadine (ALLEGRA) 180 MG tablet Take 1 tablet (180 mg total) by mouth daily. 30 tablet 11   fluticasone (FLONASE) 50 MCG/ACT nasal spray SPRAY TWO SPRAYS IN EACH NOSTRIL ONCE DAILY 16 g 6   losartan (COZAAR) 50 MG tablet Take 1 tablet (50 mg total) by mouth 2 (two) times daily. 180 tablet 2   metoprolol tartrate (LOPRESSOR) 50 MG tablet TAKE ONE TABLET BY MOUTH TWICE A DAY 60 tablet 2   pravastatin (PRAVACHOL) 20 MG tablet Take 1 tablet (20 mg total) by mouth daily. 90 tablet 2   Probiotic Product (PROBIOTIC ADVANCED PO) Take by mouth.     No current facility-administered medications on file prior to visit.   Allergies  Allergen Reactions   Niacin And Related Other (See Comments)    Passes out   Social History   Socioeconomic History   Marital status: Married    Spouse name: Not on file   Number of children: 2   Years of education: Not on file   Highest education level: Not on file  Occupational History   Occupation: Print production planner  Tobacco Use   Smoking status: Never   Smokeless tobacco: Never  Substance and Sexual Activity  Alcohol use: Yes    Alcohol/week: 0.0 standard drinks    Comment: 1-2 times in one year    Drug use: No   Sexual activity: Yes  Other Topics Concern   Not on file  Social History Narrative   Not on file   Social Determinants of Health   Financial Resource Strain: Not on file  Food Insecurity: Not on file  Transportation Needs: Not on file  Physical Activity: Not on file  Stress: Not on file  Social Connections: Not on file  Intimate Partner Violence: Not on file     Review of Systems  All other systems reviewed and are negative.     Objective:   Physical Exam Vitals reviewed.  Constitutional:      General: She is not in acute distress.    Appearance: Normal appearance. She is normal weight. She is not ill-appearing, toxic-appearing or diaphoretic.  HENT:     Head: Normocephalic and atraumatic.     Right Ear:  Tympanic membrane and ear canal normal. There is no impacted cerumen.     Left Ear: Tympanic membrane and ear canal normal. There is no impacted cerumen.     Nose: Nose normal. No congestion or rhinorrhea.     Mouth/Throat:     Mouth: Mucous membranes are moist.     Pharynx: Oropharynx is clear. No oropharyngeal exudate or posterior oropharyngeal erythema.  Eyes:     General: No scleral icterus.       Right eye: No discharge.        Left eye: No discharge.     Extraocular Movements: Extraocular movements intact.     Conjunctiva/sclera: Conjunctivae normal.     Pupils: Pupils are equal, round, and reactive to light.  Neck:     Vascular: No carotid bruit.  Cardiovascular:     Rate and Rhythm: Normal rate and regular rhythm.     Heart sounds: Normal heart sounds. No murmur heard.   No friction rub. No gallop.  Pulmonary:     Effort: Pulmonary effort is normal. No respiratory distress.     Breath sounds: Normal breath sounds. No stridor. No wheezing, rhonchi or rales.  Chest:     Chest wall: No tenderness.  Abdominal:     General: Bowel sounds are normal. There is no distension.     Palpations: Abdomen is soft.     Tenderness: There is no abdominal tenderness. There is no right CVA tenderness, left CVA tenderness, guarding or rebound.     Hernia: No hernia is present.  Musculoskeletal:        General: No swelling, tenderness, deformity or signs of injury.     Cervical back: Normal range of motion and neck supple. No rigidity or tenderness.     Right lower leg: No edema.     Left lower leg: No edema.  Lymphadenopathy:     Cervical: No cervical adenopathy.  Skin:    General: Skin is warm.     Coloration: Skin is not jaundiced or pale.     Findings: No bruising, erythema, lesion or rash.  Neurological:     General: No focal deficit present.     Mental Status: She is alert and oriented to person, place, and time. Mental status is at baseline.     Cranial Nerves: No cranial nerve  deficit.     Sensory: No sensory deficit.     Motor: No weakness.     Coordination: Coordination normal.  Gait: Gait normal.     Deep Tendon Reflexes: Reflexes normal.  Psychiatric:        Mood and Affect: Mood normal.        Behavior: Behavior normal.        Thought Content: Thought content normal.        Judgment: Judgment normal.          Assessment & Plan:  Diarrhea of infectious origin - Plan: Gastrointestinal Pathogen Panel PCR, Ova and parasite examination Given the exposure to shellfish, the most likely pathogen would be vibrio cholera.  Cipro should have treated that.  However we will try azithromycin 500 mg p.o. daily for 3 days.  However prior to starting treatment, I would like the patient provide a stool sample so that I can check a GI pathogen panel to evaluate for Salmonella, Shigella, etc.  I would also like to check stool O&P again.  Once she has returned the stool sample she can take the antibiotics and treat empirically for possible Cholera.

## 2020-08-07 ENCOUNTER — Other Ambulatory Visit: Payer: Self-pay | Admitting: Family Medicine

## 2020-08-07 ENCOUNTER — Other Ambulatory Visit: Payer: Commercial Managed Care - PPO

## 2020-08-07 ENCOUNTER — Telehealth: Payer: Self-pay | Admitting: *Deleted

## 2020-08-07 ENCOUNTER — Other Ambulatory Visit: Payer: Self-pay

## 2020-08-07 DIAGNOSIS — A09 Infectious gastroenteritis and colitis, unspecified: Secondary | ICD-10-CM

## 2020-08-07 NOTE — Telephone Encounter (Signed)
Patient returned stool sample for GI pathogen testing.   Was advised by lab that GI Pathogen Panel is no longer accepted by [facility].   Plan from 08/04/2020:  I would like the patient provide a stool sample so that I can check a GI pathogen panel to evaluate for Salmonella, Shigella, etc.  I would also like to check stool O&P again.  Please advise.

## 2020-08-10 ENCOUNTER — Telehealth: Payer: Self-pay | Admitting: *Deleted

## 2020-08-10 LAB — OVA AND PARASITE EXAMINATION
CONCENTRATE RESULT:: NONE SEEN
MICRO NUMBER:: 12026800
SPECIMEN QUALITY:: ADEQUATE
TRICHROME RESULT:: NONE SEEN

## 2020-08-10 NOTE — Telephone Encounter (Signed)
Received call from patient.   Reports that she received letter from [facility]. States that letter advised it has been 5 years since colonoscopy, and they recommend Cologuard.   Ok to order?

## 2020-08-11 NOTE — Telephone Encounter (Signed)
Order placed via [employer].   Cologuard (Order [id number])

## 2020-08-14 LAB — VIBRIO CULTURE, STOOL
Micro Number:: 12026805
Specimen Quality:: ADEQUATE

## 2020-08-14 LAB — SALMONELLA/SHIGELLA CULT, CAMPY EIA AND SHIGA TOXIN RFL ECOLI
MICRO NUMBER: 12026801
MICRO NUMBER:: 12026802
MICRO NUMBER:: 12026804
Result:: NOT DETECTED
SHIGA RESULT:: NOT DETECTED
SPECIMEN QUALITY: ADEQUATE
SPECIMEN QUALITY:: ADEQUATE
SPECIMEN QUALITY:: ADEQUATE

## 2020-08-14 LAB — AEROBIC CULTURE

## 2020-08-15 ENCOUNTER — Other Ambulatory Visit: Payer: Self-pay | Admitting: *Deleted

## 2020-08-15 MED ORDER — LOSARTAN POTASSIUM 50 MG PO TABS: 50.0000 mg | ORAL_TABLET | Freq: Two times a day (BID) | ORAL | 2 refills | Status: DC

## 2020-08-28 LAB — COLOGUARD: Cologuard: NEGATIVE

## 2020-09-01 LAB — COLOGUARD: COLOGUARD: NEGATIVE

## 2020-09-04 NOTE — Telephone Encounter (Signed)
Received the results of Cologuard screening.   Screening noted negative.   A negative result indicates a low likelihood of colorectal cancer is present. Following a negative Cologuard result, the American Cancer Society recommends a Cologuard re-screening interval of 3 years.   Letter sent.   

## 2020-09-13 ENCOUNTER — Other Ambulatory Visit: Payer: Self-pay | Admitting: *Deleted

## 2020-09-13 ENCOUNTER — Other Ambulatory Visit: Payer: Self-pay | Admitting: Family Medicine

## 2020-09-13 MED ORDER — PRAVASTATIN SODIUM 20 MG PO TABS: 20.0000 mg | ORAL_TABLET | Freq: Every day | ORAL | 2 refills | Status: DC

## 2020-12-12 ENCOUNTER — Other Ambulatory Visit: Payer: Self-pay | Admitting: Family Medicine

## 2020-12-19 DIAGNOSIS — E785 Hyperlipidemia, unspecified: Secondary | ICD-10-CM | POA: Insufficient documentation

## 2020-12-19 DIAGNOSIS — I10 Essential (primary) hypertension: Secondary | ICD-10-CM | POA: Insufficient documentation

## 2021-01-19 ENCOUNTER — Other Ambulatory Visit: Payer: Self-pay

## 2021-01-19 DIAGNOSIS — E7800 Pure hypercholesterolemia, unspecified: Secondary | ICD-10-CM

## 2021-01-19 DIAGNOSIS — I10 Essential (primary) hypertension: Secondary | ICD-10-CM

## 2021-01-19 DIAGNOSIS — E8881 Metabolic syndrome: Secondary | ICD-10-CM

## 2021-01-19 DIAGNOSIS — Z0000 Encounter for general adult medical examination without abnormal findings: Secondary | ICD-10-CM

## 2021-01-23 ENCOUNTER — Other Ambulatory Visit: Payer: 59

## 2021-01-25 ENCOUNTER — Encounter: Payer: Self-pay | Admitting: Family Medicine

## 2021-01-25 ENCOUNTER — Other Ambulatory Visit: Payer: Self-pay

## 2021-01-25 ENCOUNTER — Ambulatory Visit (INDEPENDENT_AMBULATORY_CARE_PROVIDER_SITE_OTHER): Payer: Commercial Managed Care - PPO | Admitting: Family Medicine

## 2021-01-25 VITALS — BP 162/98 | HR 82 | Resp 18 | Ht 63.0 in | Wt 179.0 lb

## 2021-01-25 DIAGNOSIS — I10 Essential (primary) hypertension: Secondary | ICD-10-CM | POA: Diagnosis not present

## 2021-01-25 DIAGNOSIS — E7800 Pure hypercholesterolemia, unspecified: Secondary | ICD-10-CM | POA: Diagnosis not present

## 2021-01-25 DIAGNOSIS — Z0000 Encounter for general adult medical examination without abnormal findings: Secondary | ICD-10-CM

## 2021-01-25 MED ORDER — ESCITALOPRAM OXALATE 10 MG PO TABS: 10.0000 mg | ORAL_TABLET | Freq: Every day | ORAL | 3 refills | Status: DC

## 2021-01-25 NOTE — Progress Notes (Signed)
Subjective:    Patient ID: [name], female    DOB: [birth year], 55 y.o.   MRN: [id number] Patient is a very pleasant 55 year old Caucasian female who presents today for complete physical exam.  She sees her gynecologist for her pelvic exam, mammogram, and bone density.  The mammogram and bone density was performed earlier this year and was normal.  She had a Cologuard earlier this year that was negative.  She has had her flu shot, by valent COVID-vaccine, and the shingles vaccine.  Therefore her immunizations are up-to-date.  Her blood pressure is extremely high today but she attributes this to nerves and anxiety and whitecoat syndrome.  She also reports increasing anxiety and would like to resume the Lexapro that she took in the past  No visits with results within 1 Month(s) from this visit.  Latest known visit with results is:  Telephone on 08/10/2020  Component Date Value Ref Range Status   Cologuard 08/28/2020 Negative  Negative Final    Past Medical History:  Diagnosis Date   Hyperlipidemia    Hypertension    Metabolic syndrome    PONV (postoperative nausea and vomiting)    nausea even after zofran and decadron   Tracheal stenosis    Past Surgical History:  Procedure Laterality Date   ADENOIDECTOMY     age 62   bladder sling surgery x 2     CLEFT PALATE REPAIR     infant   OTHER SURGICAL HISTORY  05/2014, 09/2014   Tracheal bronchoscopy and dilation - performed by Dr. Harriette Ohara at [facility]   ROBOTIC ASSISTED BILATERAL SALPINGO OOPHERECTOMY Bilateral 01/31/2015   Procedure: ROBOTIC ASSISTED BILATERAL SALPINGO OOPHORECTOMY AND POSSIBLE LYSIS OF ADHESIONS;  Surgeon: Adolphus Birchwood, MD;  Location: [facility];  Service: Gynecology;  Laterality: Bilateral;   TONSILLECTOMY     age 62   trachea laser and dilation x 2     trachea stenosis   TUBAL LIGATION     1992   tubes in ears as child     VAGINAL HYSTERECTOMY     partial 1995   Current Outpatient Medications on File Prior  to Visit  Medication Sig Dispense Refill   Ascorbic Acid (VITAMIN C) 1000 MG tablet Take 1,000 mg by mouth daily.     cholecalciferol (VITAMIN D3) 25 MCG (1000 UNIT) tablet Take 1,000 Units by mouth daily.     fexofenadine (ALLEGRA) 180 MG tablet Take 1 tablet (180 mg total) by mouth daily. 30 tablet 11   fluticasone (FLONASE) 50 MCG/ACT nasal spray SPRAY TWO SPRAYS IN EACH NOSTRIL ONCE DAILY 16 g 6   losartan (COZAAR) 50 MG tablet Take 1 tablet (50 mg total) by mouth 2 (two) times daily. 180 tablet 2   metoprolol tartrate (LOPRESSOR) 50 MG tablet TAKE ONE TABLET BY MOUTH TWICE A DAY 60 tablet 2   pravastatin (PRAVACHOL) 20 MG tablet Take 1 tablet (20 mg total) by mouth daily. 90 tablet 2   Probiotic Product (PROBIOTIC ADVANCED PO) Take by mouth.     No current facility-administered medications on file prior to visit.   Allergies  Allergen Reactions   Niacin And Related Other (See Comments)    Passes out   Social History   Socioeconomic History   Marital status: Married    Spouse name: Not on file   Number of children: 2   Years of education: Not on file   Highest education level: Not on file  Occupational History   Occupation:  office manager  Tobacco Use   Smoking status: Never   Smokeless tobacco: Never  Substance and Sexual Activity   Alcohol use: Yes    Alcohol/week: 0.0 standard drinks    Comment: 1-2 times in one year    Drug use: No   Sexual activity: Yes  Other Topics Concern   Not on file  Social History Narrative   Not on file   Social Determinants of Health   Financial Resource Strain: Not on file  Food Insecurity: Not on file  Transportation Needs: Not on file  Physical Activity: Not on file  Stress: Not on file  Social Connections: Not on file  Intimate Partner Violence: Not on file     Review of Systems  All other systems reviewed and are negative.     Objective:   Physical Exam Vitals reviewed.  Constitutional:      General: She is not in  acute distress.    Appearance: Normal appearance. She is normal weight. She is not ill-appearing, toxic-appearing or diaphoretic.  HENT:     Head: Normocephalic and atraumatic.     Right Ear: Tympanic membrane and ear canal normal. There is no impacted cerumen.     Left Ear: Tympanic membrane and ear canal normal. There is no impacted cerumen.     Nose: Nose normal. No congestion or rhinorrhea.     Mouth/Throat:     Mouth: Mucous membranes are moist.     Pharynx: Oropharynx is clear. No oropharyngeal exudate or posterior oropharyngeal erythema.  Eyes:     General: No scleral icterus.       Right eye: No discharge.        Left eye: No discharge.     Extraocular Movements: Extraocular movements intact.     Conjunctiva/sclera: Conjunctivae normal.     Pupils: Pupils are equal, round, and reactive to light.  Neck:     Vascular: No carotid bruit.  Cardiovascular:     Rate and Rhythm: Normal rate and regular rhythm.     Heart sounds: Normal heart sounds. No murmur heard.   No friction rub. No gallop.  Pulmonary:     Effort: Pulmonary effort is normal. No respiratory distress.     Breath sounds: Normal breath sounds. No stridor. No wheezing, rhonchi or rales.  Chest:     Chest wall: No tenderness.  Abdominal:     General: Bowel sounds are normal. There is no distension.     Palpations: Abdomen is soft.     Tenderness: There is no abdominal tenderness. There is no right CVA tenderness, left CVA tenderness, guarding or rebound.     Hernia: No hernia is present.  Musculoskeletal:        General: No swelling, tenderness, deformity or signs of injury.     Cervical back: Normal range of motion and neck supple. No rigidity or tenderness.     Right lower leg: No edema.     Left lower leg: No edema.  Lymphadenopathy:     Cervical: No cervical adenopathy.  Skin:    General: Skin is warm.     Coloration: Skin is not jaundiced or pale.     Findings: No bruising, erythema, lesion or rash.   Neurological:     General: No focal deficit present.     Mental Status: She is alert and oriented to person, place, and time. Mental status is at baseline.     Cranial Nerves: No cranial nerve deficit.  Sensory: No sensory deficit.     Motor: No weakness.     Coordination: Coordination normal.     Gait: Gait normal.     Deep Tendon Reflexes: Reflexes normal.  Psychiatric:        Mood and Affect: Mood normal.        Behavior: Behavior normal.        Thought Content: Thought content normal.        Judgment: Judgment normal.          Assessment & Plan:  Benign essential HTN - Plan: CBC with Differential/Platelet, COMPLETE METABOLIC PANEL WITH GFR, Lipid panel  General medical exam  Pure hypercholesterolemia I am concerned about her blood pressure.  Of asked her to check it multiple times a day over the weekend and call me with the values next week.  Consistently greater than 140/90 we will need to either increase losartan or add hydrochlorothiazide.  Immunizations are up-to-date.  Cancer screening is up-to-date.  The remainder of her preventative care is up-to-date including bone density test.  We will resume Lexapro 10 mg a day and reassess in 4 to 6 weeks.

## 2021-01-26 ENCOUNTER — Other Ambulatory Visit: Payer: Self-pay

## 2021-01-26 DIAGNOSIS — R739 Hyperglycemia, unspecified: Secondary | ICD-10-CM

## 2021-01-29 LAB — COMPLETE METABOLIC PANEL WITH GFR
AG Ratio: 1.9 (calc) (ref 1.0–2.5)
ALT: 33 U/L — ABNORMAL HIGH (ref 6–29)
AST: 19 U/L (ref 10–35)
Albumin: 4.7 g/dL (ref 3.6–5.1)
Alkaline phosphatase (APISO): 76 U/L (ref 37–153)
BUN: 18 mg/dL (ref 7–25)
CO2: 25 mmol/L (ref 20–32)
Calcium: 9.6 mg/dL (ref 8.6–10.4)
Chloride: 106 mmol/L (ref 98–110)
Creat: 0.68 mg/dL (ref 0.50–1.03)
Globulin: 2.5 g/dL (calc) (ref 1.9–3.7)
Glucose, Bld: 148 mg/dL — ABNORMAL HIGH (ref 65–99)
Potassium: 4.9 mmol/L (ref 3.5–5.3)
Sodium: 141 mmol/L (ref 135–146)
Total Bilirubin: 1 mg/dL (ref 0.2–1.2)
Total Protein: 7.2 g/dL (ref 6.1–8.1)
eGFR: 102 mL/min/{1.73_m2} (ref >=60)

## 2021-01-29 LAB — CBC WITH DIFFERENTIAL/PLATELET
Absolute Monocytes: 511 cells/uL (ref 200–950)
Basophils Absolute: 52 cells/uL (ref 0–200)
Basophils Relative: 0.7 %
Eosinophils Absolute: 37 cells/uL (ref 15–500)
Eosinophils Relative: 0.5 %
HCT: 43.4 % (ref 35.0–45.0)
Hemoglobin: 14.4 g/dL (ref 11.7–15.5)
Lymphs Abs: 2768 cells/uL (ref 850–3900)
MCH: 31.9 pg (ref 27.0–33.0)
MCHC: 33.2 g/dL (ref 32.0–36.0)
MCV: 96.2 fL (ref 80.0–100.0)
MPV: 9.8 fL (ref 7.5–12.5)
Monocytes Relative: 6.9 %
Neutro Abs: 4033 cells/uL (ref 1500–7800)
Neutrophils Relative %: 54.5 %
Platelets: 300 10*3/uL (ref 140–400)
RBC: 4.51 10*6/uL (ref 3.80–5.10)
RDW: 12.3 % (ref 11.0–15.0)
Total Lymphocyte: 37.4 %
WBC: 7.4 10*3/uL (ref 3.8–10.8)

## 2021-01-29 LAB — TEST AUTHORIZATION

## 2021-01-29 LAB — LIPID PANEL
Cholesterol: 149 mg/dL (ref <200)
HDL: 40 mg/dL — ABNORMAL LOW (ref >=50)
LDL Cholesterol (Calc): 84 mg/dL (calc)
Non-HDL Cholesterol (Calc): 109 mg/dL (calc) (ref <130)
Total CHOL/HDL Ratio: 3.7 (calc) (ref <5.0)
Triglycerides: 151 mg/dL — ABNORMAL HIGH (ref <150)

## 2021-01-29 LAB — HEMOGLOBIN A1C W/OUT EAG: Hgb A1c MFr Bld: 6.4 % of total Hgb — ABNORMAL HIGH (ref <5.7)

## 2021-01-30 ENCOUNTER — Encounter: Payer: Self-pay | Admitting: Family Medicine

## 2021-01-30 ENCOUNTER — Other Ambulatory Visit: Payer: Self-pay

## 2021-01-30 ENCOUNTER — Ambulatory Visit: Payer: Commercial Managed Care - PPO | Admitting: Family Medicine

## 2021-01-30 VITALS — BP 152/98 | HR 82 | Temp 97.6°F | Ht 63.0 in | Wt 174.0 lb

## 2021-01-30 DIAGNOSIS — I10 Essential (primary) hypertension: Secondary | ICD-10-CM

## 2021-01-30 MED ORDER — ALPRAZOLAM 0.5 MG PO TABS: 0.5000 mg | ORAL_TABLET | Freq: Three times a day (TID) | ORAL | 0 refills | Status: AC | PRN

## 2021-01-30 MED ORDER — HYDROCHLOROTHIAZIDE 25 MG PO TABS: 25.0000 mg | ORAL_TABLET | Freq: Every day | ORAL | 3 refills | Status: DC

## 2021-01-30 NOTE — Progress Notes (Signed)
Subjective:    Patient ID: [name], female    DOB: Jan 11, 1966, 55 y.o.   MRN: [id number]  Patient is a very sweet 55 year old Caucasian female.  At her most recent physical, her blood pressure was elevated.  She has been checking her blood pressure at home and been reporting systolic blood pressures in the 150-160 range.  She also has become extremely anxious.  She is worried about her blood pressure.  She is worried about her A1c which was 6.4.  She is already started exercising however her anxiety has gotten to the point he is experiencing nausea in the morning and even occasional vomiting.  She is lost 5 pounds since I last saw her because her appetite has declined so suddenly.  Lexapro has not had a significant opportunity to work as she has only been on the medication for the last 1 week  Past Medical History:  Diagnosis Date   Hyperlipidemia    Hypertension    Metabolic syndrome    PONV (postoperative nausea and vomiting)    nausea even after zofran and decadron   Tracheal stenosis    Past Surgical History:  Procedure Laterality Date   ADENOIDECTOMY     age 55   bladder sling surgery x 2     CLEFT PALATE REPAIR     infant   OTHER SURGICAL HISTORY  05/2014, 09/2014   Tracheal bronchoscopy and dilation - performed by Dr. Harriette Ohara at [facility]   ROBOTIC ASSISTED BILATERAL SALPINGO OOPHERECTOMY Bilateral 01/31/2015   Procedure: ROBOTIC ASSISTED BILATERAL SALPINGO OOPHORECTOMY AND POSSIBLE LYSIS OF ADHESIONS;  Surgeon: Adolphus Birchwood, MD;  Location: [facility];  Service: Gynecology;  Laterality: Bilateral;   TONSILLECTOMY     age 55   trachea laser and dilation x 2     trachea stenosis   TUBAL LIGATION     1992   tubes in ears as child     VAGINAL HYSTERECTOMY     partial 1995   Current Outpatient Medications on File Prior to Visit  Medication Sig Dispense Refill   Ascorbic Acid (VITAMIN C) 1000 MG tablet Take 1,000 mg by mouth daily.     cholecalciferol (VITAMIN D3) 25 MCG  (1000 UNIT) tablet Take 1,000 Units by mouth daily.     escitalopram (LEXAPRO) 10 MG tablet Take 1 tablet (10 mg total) by mouth daily. 30 tablet 3   fexofenadine (ALLEGRA) 180 MG tablet Take 1 tablet (180 mg total) by mouth daily. 30 tablet 11   fluticasone (FLONASE) 50 MCG/ACT nasal spray SPRAY TWO SPRAYS IN EACH NOSTRIL ONCE DAILY 16 g 6   losartan (COZAAR) 50 MG tablet Take 1 tablet (50 mg total) by mouth 2 (two) times daily. 180 tablet 2   metoprolol tartrate (LOPRESSOR) 50 MG tablet TAKE ONE TABLET BY MOUTH TWICE A DAY 60 tablet 2   pravastatin (PRAVACHOL) 20 MG tablet Take 1 tablet (20 mg total) by mouth daily. 90 tablet 2   Probiotic Product (PROBIOTIC ADVANCED PO) Take by mouth.     No current facility-administered medications on file prior to visit.   Allergies  Allergen Reactions   Niacin And Related Other (See Comments)    Passes out   Social History   Socioeconomic History   Marital status: Married    Spouse name: Not on file   Number of children: 2   Years of education: Not on file   Highest education level: Not on file  Occupational History   Occupation:  office manager  Tobacco Use   Smoking status: Never   Smokeless tobacco: Never  Substance and Sexual Activity   Alcohol use: Yes    Alcohol/week: 0.0 standard drinks    Comment: 1-2 times in one year    Drug use: No   Sexual activity: Yes  Other Topics Concern   Not on file  Social History Narrative   Not on file   Social Determinants of Health   Financial Resource Strain: Not on file  Food Insecurity: Not on file  Transportation Needs: Not on file  Physical Activity: Not on file  Stress: Not on file  Social Connections: Not on file  Intimate Partner Violence: Not on file     Review of Systems  All other systems reviewed and are negative.     Objective:   Physical Exam Vitals reviewed.  Constitutional:      General: She is not in acute distress.    Appearance: Normal appearance. She is  normal weight. She is not ill-appearing, toxic-appearing or diaphoretic.  HENT:     Head: Normocephalic and atraumatic.     Right Ear: Tympanic membrane and ear canal normal. There is no impacted cerumen.     Left Ear: Tympanic membrane and ear canal normal. There is no impacted cerumen.     Nose: Nose normal. No congestion or rhinorrhea.     Mouth/Throat:     Mouth: Mucous membranes are moist.     Pharynx: Oropharynx is clear. No oropharyngeal exudate or posterior oropharyngeal erythema.  Eyes:     General: No scleral icterus.       Right eye: No discharge.        Left eye: No discharge.     Extraocular Movements: Extraocular movements intact.     Conjunctiva/sclera: Conjunctivae normal.     Pupils: Pupils are equal, round, and reactive to light.  Neck:     Vascular: No carotid bruit.  Cardiovascular:     Rate and Rhythm: Normal rate and regular rhythm.     Heart sounds: Normal heart sounds. No murmur heard.   No friction rub. No gallop.  Pulmonary:     Effort: Pulmonary effort is normal. No respiratory distress.     Breath sounds: Normal breath sounds. No stridor. No wheezing, rhonchi or rales.  Chest:     Chest wall: No tenderness.  Abdominal:     General: Bowel sounds are normal. There is no distension.     Palpations: Abdomen is soft.     Tenderness: There is no abdominal tenderness. There is no right CVA tenderness, left CVA tenderness, guarding or rebound.     Hernia: No hernia is present.  Musculoskeletal:        General: No swelling, tenderness, deformity or signs of injury.     Cervical back: Normal range of motion and neck supple. No rigidity or tenderness.     Right lower leg: No edema.     Left lower leg: No edema.  Lymphadenopathy:     Cervical: No cervical adenopathy.  Skin:    General: Skin is warm.     Coloration: Skin is not jaundiced or pale.     Findings: No bruising, erythema, lesion or rash.  Neurological:     General: No focal deficit present.      Mental Status: She is alert and oriented to person, place, and time. Mental status is at baseline.     Cranial Nerves: No cranial nerve deficit.  Sensory: No sensory deficit.     Motor: No weakness.     Coordination: Coordination normal.     Gait: Gait normal.     Deep Tendon Reflexes: Reflexes normal.  Psychiatric:        Mood and Affect: Mood normal.        Behavior: Behavior normal.        Thought Content: Thought content normal.        Judgment: Judgment normal.          Assessment & Plan:  Benign essential HTN Patient's blood pressure is elevated.  I would like to add hydrochlorothiazide 25 mg daily to her metoprolol and her losartan.  I did give the patient a prescription for Xanax and instructed her to use it sparingly as needed for anxiety.  Also try to reassure the patient about her lab work.  She is going to be okay.  I encouraged her to continue to work on lifestyle changes to address her sugars.  Recheck blood pressure in 2 weeks

## 2021-03-12 ENCOUNTER — Other Ambulatory Visit: Payer: Self-pay | Admitting: Family Medicine

## 2021-04-11 ENCOUNTER — Other Ambulatory Visit: Payer: Self-pay | Admitting: Family Medicine

## 2021-05-22 ENCOUNTER — Telehealth: Payer: Self-pay | Admitting: Family Medicine

## 2021-05-22 MED ORDER — ESCITALOPRAM OXALATE 10 MG PO TABS: 10.0000 mg | ORAL_TABLET | Freq: Every day | ORAL | 3 refills | Status: DC

## 2021-05-22 NOTE — Telephone Encounter (Signed)
Received eFax from pharmacy to request refill of ? ?escitalopram (LEXAPRO) 10 MG tablet [[id number]]  ? ?Pharmacy eFax received from:  ? ?[id number] [place], [place] - [address] [facility] [address]  ?[address], [place] [address]  ?Phone:  [phone]  Fax:  [phone]  ? ?Please advise pharmacist. ? ? ?

## 2021-06-20 ENCOUNTER — Other Ambulatory Visit: Payer: Self-pay | Admitting: Family Medicine

## 2021-06-20 NOTE — Telephone Encounter (Signed)
Requested Prescriptions  ?Pending Prescriptions Disp Refills  ?? metoprolol tartrate (LOPRESSOR) 50 MG tablet [Pharmacy Med Name: METOPROLOL TARTRATE 50 MG TAB] 180 tablet 0  ?  Sig: TAKE ONE TABLET BY MOUTH TWICE A DAY  ?  ? Cardiovascular:  Beta Blockers Failed - 06/20/2021 12:06 PM  ?  ?  Failed - Last BP in normal range  ?  BP Readings from Last 1 Encounters:  ?01/30/21 (!) 152/98  ?   ?  ?  Passed - Last Heart Rate in normal range  ?  Pulse Readings from Last 1 Encounters:  ?01/30/21 82  ?   ?  ?  Passed - Valid encounter within last 6 months  ?  Recent Outpatient Visits   ?      ? 4 months ago Benign essential HTN  ? [facility] Pickard, Cammie Mcgee, MD  ? 4 months ago Benign essential HTN  ? [facility] Pickard, Cammie Mcgee, MD  ? 10 months ago Diarrhea of infectious origin  ? [facility] Pickard, Cammie Mcgee, MD  ? 11 months ago Diarrhea, unspecified type  ? [facility] Eulogio Bear, NP  ? 1 year ago Pure hypercholesterolemia  ? [facility] Pickard, Cammie Mcgee, MD  ?  ?  ? ?  ?  ?  ?? pravastatin (PRAVACHOL) 20 MG tablet [Pharmacy Med Name: PRAVASTATIN SODIUM 20 MG TAB] 90 tablet 2  ?  Sig: TAKE ONE TABLET BY MOUTH DAILY  ?  ? Cardiovascular:  Antilipid - Statins Failed - 06/20/2021 12:06 PM  ?  ?  Failed - Lipid Panel in normal range within the last 12 months  ?  Cholesterol  ?Date Value Ref Range Status  ?01/25/2021 149 <200 mg/dL Final  ? ?LDL Cholesterol (Calc)  ?Date Value Ref Range Status  ?01/25/2021 84 mg/dL (calc) Final  ?  Comment:  ?  Reference range: <100 ?Marland Kitchen ?Desirable range <100 mg/dL for primary prevention;   ?<70 mg/dL for patients with CHD or diabetic patients  ?with > or = 2 CHD risk factors. ?. ?LDL-C is now calculated using the Martin-Hopkins  ?calculation, which is a validated novel method providing  ?better accuracy than the Friedewald equation in the  ?estimation of LDL-C.  ?Cresenciano Genre et al. Annamaria Helling. [id number]):  2061-2068  ?(http://education.[facility].com/faq/FAQ164) ?  ? ?HDL  ?Date Value Ref Range Status  ?01/25/2021 40 (L) > OR = 50 mg/dL Final  ? ?Triglycerides  ?Date Value Ref Range Status  ?01/25/2021 151 (H) <150 mg/dL Final  ? ?  ?  ?  Passed - Patient is not pregnant  ?  ?  Passed - Valid encounter within last 12 months  ?  Recent Outpatient Visits   ?      ? 4 months ago Benign essential HTN  ? [facility] Pickard, Cammie Mcgee, MD  ? 4 months ago Benign essential HTN  ? [facility] Pickard, Cammie Mcgee, MD  ? 10 months ago Diarrhea of infectious origin  ? [facility] Pickard, Cammie Mcgee, MD  ? 11 months ago Diarrhea, unspecified type  ? [place] Eulogio Bear, NP  ? 1 year ago Pure hypercholesterolemia  ? [facility] Pickard, Cammie Mcgee, MD  ?  ?  ? ?  ?  ?  ? ?

## 2021-09-09 ENCOUNTER — Other Ambulatory Visit: Payer: Self-pay | Admitting: Family Medicine

## 2021-09-11 ENCOUNTER — Other Ambulatory Visit: Payer: Self-pay

## 2021-09-11 MED ORDER — ESCITALOPRAM OXALATE 10 MG PO TABS: 10.0000 mg | ORAL_TABLET | Freq: Every day | ORAL | 3 refills | Status: DC

## 2022-01-09 ENCOUNTER — Other Ambulatory Visit: Payer: Self-pay

## 2022-01-09 MED ORDER — ESCITALOPRAM OXALATE 10 MG PO TABS: 10.0000 mg | ORAL_TABLET | Freq: Every day | ORAL | 0 refills | Status: DC

## 2022-01-09 NOTE — Telephone Encounter (Signed)
Courtesy refill given, appointment needed.   Requested Prescriptions  Pending Prescriptions Disp Refills   escitalopram (LEXAPRO) 10 MG tablet 30 tablet 0    Sig: Take 1 tablet (10 mg total) by mouth daily.     Psychiatry:  Antidepressants - SSRI Failed - 01/09/2022 12:57 PM      Failed - Valid encounter within last 6 months    Recent Outpatient Visits           11 months ago Benign essential HTN   [facility] Tanya Nones, Priscille Heidelberg, MD   11 months ago Benign essential HTN   [facility] Tanya Nones, Priscille Heidelberg, MD   1 year ago Diarrhea of infectious origin   [facility] Pickard, Priscille Heidelberg, MD   1 year ago Diarrhea, unspecified type   [facility] Valentino Nose, NP   1 year ago Pure hypercholesterolemia   [facility] Pickard, Priscille Heidelberg, MD       Future Appointments             In 1 month Pickard, Priscille Heidelberg, MD [facility]

## 2022-01-09 NOTE — Telephone Encounter (Signed)
  Prescription Request  01/09/2022  Is this a "Controlled Substance" medicine? No  LOV: 09/11/2021   What is the name of the medication or equipment? escitalopram (LEXAPRO) 10 MG tablet [[id number]]   Have you contacted your pharmacy to request a refill? Yes   Which pharmacy would you like this sent to?  [facility] [id number] [place], [place] - [address] [facility] [address] [place] [address] Phone: [phone] Fax: [phone]   Patient notified that their request is being sent to the clinical staff for review and that they should receive a response within 2 business days.   Please advise at [phone]

## 2022-01-09 NOTE — Telephone Encounter (Signed)
Patient called and advised the need for an appointment for physical, she agreed. Scheduled for 01/21/22 at 1115. She asked if she could come to the office for labs. I placed her on hold and called the office to speak to [place] who asked to speak to the patient to schedule and reschedule the CPE. Call connected successfully.

## 2022-01-15 ENCOUNTER — Other Ambulatory Visit: Payer: Self-pay | Admitting: Family Medicine

## 2022-01-22 ENCOUNTER — Encounter: Payer: 59 | Admitting: Family Medicine

## 2022-01-22 ENCOUNTER — Other Ambulatory Visit: Payer: Self-pay

## 2022-01-22 NOTE — Telephone Encounter (Signed)
Requested medication (s) are due for refill today:   Yes  Requested medication (s) are on the active medication list:   Yes  Future visit scheduled:   Yes 02/12/2022   Last ordered: 01/30/2021 #90, 3 refills  Returned because unable to refill due to labs being due per protocol.      Requested Prescriptions  Pending Prescriptions Disp Refills   hydrochlorothiazide (HYDRODIURIL) 25 MG tablet 90 tablet 3    Sig: Take 1 tablet (25 mg total) by mouth daily.     Cardiovascular: Diuretics - Thiazide Failed - 01/22/2022 11:24 AM      Failed - Cr in normal range and within 180 days    Creat  Date Value Ref Range Status  01/25/2021 0.68 0.50 - 1.03 mg/dL Final         Failed - K in normal range and within 180 days    Potassium  Date Value Ref Range Status  01/25/2021 4.9 3.5 - 5.3 mmol/L Final         Failed - Na in normal range and within 180 days    Sodium  Date Value Ref Range Status  01/25/2021 141 135 - 146 mmol/L Final         Failed - Last BP in normal range    BP Readings from Last 1 Encounters:  01/30/21 (!) 152/98         Failed - Valid encounter within last 6 months    Recent Outpatient Visits           11 months ago Benign essential HTN   [facility] Donita Brooks, MD   12 months ago Benign essential HTN   [facility] Tanya Nones, Priscille Heidelberg, MD   1 year ago Diarrhea of infectious origin   [facility] Pickard, Priscille Heidelberg, MD   1 year ago Diarrhea, unspecified type   [facility] Valentino Nose, NP   1 year ago Pure hypercholesterolemia   [facility] Pickard, Priscille Heidelberg, MD       Future Appointments             In 3 weeks Pickard, Priscille Heidelberg, MD [facility]

## 2022-01-22 NOTE — Telephone Encounter (Signed)
Prescription Request  01/22/2022  Is this a "Controlled Substance" medicine? No  LOV: Visit date not found  What is the name of the medication or equipment? hydrochlorothiazide (HYDRODIURIL) 25 MG tablet [[id number]]   Have you contacted your pharmacy to request a refill? Yes   Which pharmacy would you like this sent to?  [facility] [id number] [place], [place] - [address] [facility] [address] [place] [address] Phone: [phone] Fax: [phone]    Patient notified that their request is being sent to the clinical staff for review and that they should receive a response within 2 business days.   Please advise at [facility] [phone]

## 2022-01-23 MED ORDER — HYDROCHLOROTHIAZIDE 25 MG PO TABS: 25.0000 mg | ORAL_TABLET | Freq: Every day | ORAL | 3 refills | Status: DC

## 2022-02-07 ENCOUNTER — Other Ambulatory Visit: Payer: 59

## 2022-02-07 DIAGNOSIS — R739 Hyperglycemia, unspecified: Secondary | ICD-10-CM

## 2022-02-07 DIAGNOSIS — E785 Hyperlipidemia, unspecified: Secondary | ICD-10-CM

## 2022-02-07 DIAGNOSIS — E8881 Metabolic syndrome: Secondary | ICD-10-CM

## 2022-02-07 DIAGNOSIS — I10 Essential (primary) hypertension: Secondary | ICD-10-CM

## 2022-02-08 ENCOUNTER — Other Ambulatory Visit: Payer: Self-pay | Admitting: Family Medicine

## 2022-02-08 LAB — LIPID PANEL
Cholesterol: 151 mg/dL (ref <200)
HDL: 41 mg/dL — ABNORMAL LOW (ref >=50)
LDL Cholesterol (Calc): 89 mg/dL (calc)
Non-HDL Cholesterol (Calc): 110 mg/dL (calc) (ref <130)
Total CHOL/HDL Ratio: 3.7 (calc) (ref <5.0)
Triglycerides: 114 mg/dL (ref <150)

## 2022-02-08 LAB — CBC WITH DIFFERENTIAL/PLATELET
Absolute Monocytes: 450 cells/uL (ref 200–950)
Basophils Absolute: 38 cells/uL (ref 0–200)
Basophils Relative: 0.5 %
Eosinophils Absolute: 38 cells/uL (ref 15–500)
Eosinophils Relative: 0.5 %
HCT: 42.1 % (ref 35.0–45.0)
Hemoglobin: 14.3 g/dL (ref 11.7–15.5)
Lymphs Abs: 2438 cells/uL (ref 850–3900)
MCH: 32 pg (ref 27.0–33.0)
MCHC: 34 g/dL (ref 32.0–36.0)
MCV: 94.2 fL (ref 80.0–100.0)
MPV: 9.7 fL (ref 7.5–12.5)
Monocytes Relative: 6 %
Neutro Abs: 4538 cells/uL (ref 1500–7800)
Neutrophils Relative %: 60.5 %
Platelets: 268 10*3/uL (ref 140–400)
RBC: 4.47 10*6/uL (ref 3.80–5.10)
RDW: 12.1 % (ref 11.0–15.0)
Total Lymphocyte: 32.5 %
WBC: 7.5 10*3/uL (ref 3.8–10.8)

## 2022-02-08 LAB — HEMOGLOBIN A1C
Hgb A1c MFr Bld: 6 % of total Hgb — ABNORMAL HIGH (ref <5.7)
Mean Plasma Glucose: 126 mg/dL
eAG (mmol/L): 7 mmol/L

## 2022-02-08 LAB — COMPLETE METABOLIC PANEL WITH GFR
AG Ratio: 1.8 (calc) (ref 1.0–2.5)
ALT: 22 U/L (ref 6–29)
AST: 16 U/L (ref 10–35)
Albumin: 4.5 g/dL (ref 3.6–5.1)
Alkaline phosphatase (APISO): 59 U/L (ref 37–153)
BUN: 23 mg/dL (ref 7–25)
CO2: 30 mmol/L (ref 20–32)
Calcium: 9.6 mg/dL (ref 8.6–10.4)
Chloride: 103 mmol/L (ref 98–110)
Creat: 0.66 mg/dL (ref 0.50–1.03)
Globulin: 2.5 g/dL (calc) (ref 1.9–3.7)
Glucose, Bld: 121 mg/dL — ABNORMAL HIGH (ref 65–99)
Potassium: 4.5 mmol/L (ref 3.5–5.3)
Sodium: 141 mmol/L (ref 135–146)
Total Bilirubin: 0.8 mg/dL (ref 0.2–1.2)
Total Protein: 7 g/dL (ref 6.1–8.1)
eGFR: 102 mL/min/{1.73_m2} (ref >=60)

## 2022-02-08 NOTE — Telephone Encounter (Signed)
Requested medication (s) are due for refill today: routing for approval  Requested medication (s) are on the active medication list:yes  Last refill:  01/09/22  Future visit scheduled: yes  Notes to clinic:  Unable to refill per protocol, courtesy refill already given, routing for provider approval.      Requested Prescriptions  Pending Prescriptions Disp Refills   escitalopram (LEXAPRO) 10 MG tablet [Pharmacy Med Name: ESCITALOPRAM 10 MG TABLET] 30 tablet 0    Sig: TAKE 1 TABLET BY MOUTH DAILY; **MUST CALL MD FOR APPOINTMENT FOR FUTURE REFILLS     Psychiatry:  Antidepressants - SSRI Failed - 02/08/2022  6:10 AM      Failed - Valid encounter within last 6 months    Recent Outpatient Visits           1 year ago Benign essential HTN   [facility] Pickard, Priscille Heidelberg, MD   1 year ago Benign essential HTN   [facility] Donita Brooks, MD   1 year ago Diarrhea of infectious origin   [facility] Pickard, Priscille Heidelberg, MD   1 year ago Diarrhea, unspecified type   [facility] Valentino Nose, NP   2 years ago Pure hypercholesterolemia   [facility] Pickard, Priscille Heidelberg, MD       Future Appointments             In 4 days Pickard, Priscille Heidelberg, MD [facility]

## 2022-02-12 ENCOUNTER — Encounter: Payer: Self-pay | Admitting: Family Medicine

## 2022-02-12 ENCOUNTER — Ambulatory Visit (INDEPENDENT_AMBULATORY_CARE_PROVIDER_SITE_OTHER): Payer: Commercial Managed Care - PPO | Admitting: Family Medicine

## 2022-02-12 VITALS — BP 130/76 | HR 77 | Ht 63.0 in | Wt 168.8 lb

## 2022-02-12 DIAGNOSIS — Z0000 Encounter for general adult medical examination without abnormal findings: Secondary | ICD-10-CM

## 2022-02-12 DIAGNOSIS — Z23 Encounter for immunization: Secondary | ICD-10-CM | POA: Diagnosis not present

## 2022-02-12 DIAGNOSIS — R7303 Prediabetes: Secondary | ICD-10-CM | POA: Diagnosis not present

## 2022-02-12 DIAGNOSIS — Z0001 Encounter for general adult medical examination with abnormal findings: Secondary | ICD-10-CM

## 2022-02-12 DIAGNOSIS — E7800 Pure hypercholesterolemia, unspecified: Secondary | ICD-10-CM

## 2022-02-12 DIAGNOSIS — I10 Essential (primary) hypertension: Secondary | ICD-10-CM | POA: Diagnosis not present

## 2022-02-12 MED ORDER — ESCITALOPRAM OXALATE 10 MG PO TABS: 10.0000 mg | ORAL_TABLET | Freq: Every day | ORAL | 3 refills | Status: DC

## 2022-02-12 MED ORDER — LOSARTAN POTASSIUM 50 MG PO TABS: 50.0000 mg | ORAL_TABLET | Freq: Two times a day (BID) | ORAL | 3 refills | Status: DC

## 2022-02-12 MED ORDER — PRAVASTATIN SODIUM 20 MG PO TABS: 20.0000 mg | ORAL_TABLET | Freq: Every day | ORAL | 3 refills | Status: DC

## 2022-02-12 MED ORDER — FLUTICASONE PROPIONATE 50 MCG/ACT NA SUSP: NASAL | 6 refills | Status: AC

## 2022-02-12 MED ORDER — HYDROCHLOROTHIAZIDE 25 MG PO TABS: 25.0000 mg | ORAL_TABLET | Freq: Every day | ORAL | 3 refills | Status: DC

## 2022-02-12 MED ORDER — METOPROLOL TARTRATE 50 MG PO TABS: 50.0000 mg | ORAL_TABLET | Freq: Two times a day (BID) | ORAL | 3 refills | Status: DC

## 2022-02-12 NOTE — Progress Notes (Signed)
Subjective:    Patient ID: [name], female    DOB: 1966-01-21, 56 y.o.   MRN: [id number] Patient is a very pleasant 56 year old Caucasian female who presents today for complete physical exam.  She sees her gynecologist for her pelvic exam, mammogram, and bone density.  She has worked extremely hard on her diet and her A1c has dropped from 6.4-6.0.  The inflammation in her liver has also improved.  I congratulated the patient on these accomplishments.  She is due for a flu shot and a COVID booster.  She has had the shingles vaccine.  She had a Cologuard last year and this is not due again until 2025. Lab on 02/07/2022  Component Date Value Ref Range Status   WBC 02/07/2022 7.5  3.8 - 10.8 Thousand/uL Final   RBC 02/07/2022 4.47  3.80 - 5.10 Million/uL Final   Hemoglobin 02/07/2022 14.3  11.7 - 15.5 g/dL Final   HCT 02/07/2022 42.1  35.0 - 45.0 % Final   MCV 02/07/2022 94.2  80.0 - 100.0 fL Final   MCH 02/07/2022 32.0  27.0 - 33.0 pg Final   MCHC 02/07/2022 34.0  32.0 - 36.0 g/dL Final   RDW 02/07/2022 12.1  11.0 - 15.0 % Final   Platelets 02/07/2022 268  140 - 400 Thousand/uL Final   MPV 02/07/2022 9.7  7.5 - 12.5 fL Final   Neutro Abs 02/07/2022 4,538  1,500 - 7,800 cells/uL Final   Lymphs Abs 02/07/2022 2,438  850 - 3,900 cells/uL Final   Absolute Monocytes 02/07/2022 450  200 - 950 cells/uL Final   Eosinophils Absolute 02/07/2022 38  15 - 500 cells/uL Final   Basophils Absolute 02/07/2022 38  0 - 200 cells/uL Final   Neutrophils Relative % 02/07/2022 60.5  % Final   Total Lymphocyte 02/07/2022 32.5  % Final   Monocytes Relative 02/07/2022 6.0  % Final   Eosinophils Relative 02/07/2022 0.5  % Final   Basophils Relative 02/07/2022 0.5  % Final   Glucose, Bld 02/07/2022 121 (H)  65 - 99 mg/dL Final   Comment: .            Fasting reference interval . For someone without known diabetes, a glucose value between 100 and 125 mg/dL is consistent with prediabetes and should be  confirmed with a follow-up test. .    BUN 02/07/2022 23  7 - 25 mg/dL Final   Creat 02/07/2022 0.66  0.50 - 1.03 mg/dL Final   eGFR 02/07/2022 102  > OR = 60 mL/min/1.53m Final   BUN/Creatinine Ratio 02/07/2022 SEE NOTE:  6 - 22 (calc) Final   Comment:    Not Reported: BUN and Creatinine are within    reference range. .    Sodium 02/07/2022 141  135 - 146 mmol/L Final   Potassium 02/07/2022 4.5  3.5 - 5.3 mmol/L Final   Chloride 02/07/2022 103  98 - 110 mmol/L Final   CO2 02/07/2022 30  20 - 32 mmol/L Final   Calcium 02/07/2022 9.6  8.6 - 10.4 mg/dL Final   Total Protein 02/07/2022 7.0  6.1 - 8.1 g/dL Final   Albumin 02/07/2022 4.5  3.6 - 5.1 g/dL Final   Globulin 02/07/2022 2.5  1.9 - 3.7 g/dL (calc) Final   AG Ratio 02/07/2022 1.8  1.0 - 2.5 (calc) Final   Total Bilirubin 02/07/2022 0.8  0.2 - 1.2 mg/dL Final   Alkaline phosphatase (APISO) 02/07/2022 59  37 - 153 U/L Final   AST  02/07/2022 16  10 - 35 U/L Final   ALT 02/07/2022 22  6 - 29 U/L Final   Hgb A1c MFr Bld 02/07/2022 6.0 (H)  <5.7 % of total Hgb Final   Comment: For someone without known diabetes, a hemoglobin  A1c value between 5.7% and 6.4% is consistent with prediabetes and should be confirmed with a  follow-up test. . For someone with known diabetes, a value <7% indicates that their diabetes is well controlled. A1c targets should be individualized based on duration of diabetes, age, comorbid conditions, and other considerations. . This assay result is consistent with an increased risk of diabetes. . Currently, no consensus exists regarding use of hemoglobin A1c for diagnosis of diabetes for children. .    Mean Plasma Glucose 02/07/2022 126  mg/dL Final   eAG (mmol/L) 02/07/2022 7.0  mmol/L Final   Cholesterol 02/07/2022 151  <200 mg/dL Final   HDL 02/07/2022 41 (L)  > OR = 50 mg/dL Final   Triglycerides 02/07/2022 114  <150 mg/dL Final   LDL Cholesterol (Calc) 02/07/2022 89  mg/dL (calc) Final    Comment: Reference range: <100 . Desirable range <100 mg/dL for primary prevention;   <70 mg/dL for patients with CHD or diabetic patients  with > or = 2 CHD risk factors. Marland Kitchen LDL-C is now calculated using the Martin-Hopkins  calculation, which is a validated novel method providing  better accuracy than the Friedewald equation in the  estimation of LDL-C.  Cresenciano Genre et al. Annamaria Helling. 0034;917(91): 2061-2068  (http://education.[facility].com/faq/FAQ164)    Total CHOL/HDL Ratio 02/07/2022 3.7  <5.0 (calc) Final   Non-HDL Cholesterol (Calc) 02/07/2022 110  <130 mg/dL (calc) Final   Comment: For patients with diabetes plus 1 major ASCVD risk  factor, treating to a non-HDL-C goal of <100 mg/dL  (LDL-C of <70 mg/dL) is considered a therapeutic  option.     Past Medical History:  Diagnosis Date   Hyperlipidemia    Hypertension    Metabolic syndrome    PONV (postoperative nausea and vomiting)    nausea even after zofran and decadron   Tracheal stenosis    Past Surgical History:  Procedure Laterality Date   ADENOIDECTOMY     age 61   bladder sling surgery x 2     CLEFT PALATE REPAIR     infant   OTHER SURGICAL HISTORY  05/2014, 09/2014   Tracheal bronchoscopy and dilation - performed by Dr. Carol Ada at [facility] Bilateral 01/31/2015   Procedure: ROBOTIC ASSISTED BILATERAL SALPINGO OOPHORECTOMY AND POSSIBLE LYSIS OF ADHESIONS;  Surgeon: Everitt Amber, MD;  Location: [facility];  Service: Gynecology;  Laterality: Bilateral;   TONSILLECTOMY     age 52   trachea laser and dilation x 2     trachea stenosis   TUBAL LIGATION     1992   tubes in ears as child     VAGINAL HYSTERECTOMY     partial 1995   Current Outpatient Medications on File Prior to Visit  Medication Sig Dispense Refill   ALPRAZolam (XANAX) 0.5 MG tablet Take 1 tablet (0.5 mg total) by mouth 3 (three) times daily as needed. 30 tablet 0   Ascorbic Acid (VITAMIN C) 1000  MG tablet Take 1,000 mg by mouth daily.     cholecalciferol (VITAMIN D3) 25 MCG (1000 UNIT) tablet Take 1,000 Units by mouth daily.     escitalopram (LEXAPRO) 10 MG tablet Take 1 tablet (10 mg total) by  mouth daily. OFFICE VISIT NEEDED FOR ADDITIONAL REFILLS 30 tablet 0   fexofenadine (ALLEGRA) 180 MG tablet Take 1 tablet (180 mg total) by mouth daily. 30 tablet 11   fluticasone (FLONASE) 50 MCG/ACT nasal spray SPRAY TWO SPRAYS IN EACH NOSTRIL ONCE DAILY 16 g 6   hydrochlorothiazide (HYDRODIURIL) 25 MG tablet Take 1 tablet (25 mg total) by mouth daily. 90 tablet 3   losartan (COZAAR) 50 MG tablet TAKE ONE TABLET BY MOUTH TWICE A DAY 180 tablet 2   metoprolol tartrate (LOPRESSOR) 50 MG tablet TAKE ONE TABLET BY MOUTH TWICE A DAY 180 tablet 1   pravastatin (PRAVACHOL) 20 MG tablet TAKE ONE TABLET BY MOUTH DAILY 90 tablet 2   Probiotic Product (PROBIOTIC ADVANCED PO) Take by mouth.     No current facility-administered medications on file prior to visit.   Allergies  Allergen Reactions   Niacin And Related Other (See Comments)    Passes out   Social History   Socioeconomic History   Marital status: Married    Spouse name: Not on file   Number of children: 2   Years of education: Not on file   Highest education level: Not on file  Occupational History   Occupation: Glass blower/designer  Tobacco Use   Smoking status: Never   Smokeless tobacco: Never  Substance and Sexual Activity   Alcohol use: Yes    Alcohol/week: 0.0 standard drinks of alcohol    Comment: 1-2 times in one year    Drug use: No   Sexual activity: Yes  Other Topics Concern   Not on file  Social History Narrative   Not on file   Social Determinants of Health   Financial Resource Strain: Not on file  Food Insecurity: Not on file  Transportation Needs: Not on file  Physical Activity: Not on file  Stress: Not on file  Social Connections: Not on file  Intimate Partner Violence: Not on file     Review of Systems   All other systems reviewed and are negative.     Objective:   Physical Exam Vitals reviewed.  Constitutional:      General: She is not in acute distress.    Appearance: Normal appearance. She is normal weight. She is not ill-appearing, toxic-appearing or diaphoretic.  HENT:     Head: Normocephalic and atraumatic.     Right Ear: Tympanic membrane and ear canal normal. There is no impacted cerumen.     Left Ear: Tympanic membrane and ear canal normal. There is no impacted cerumen.     Nose: Nose normal. No congestion or rhinorrhea.     Mouth/Throat:     Mouth: Mucous membranes are moist.     Pharynx: Oropharynx is clear. No oropharyngeal exudate or posterior oropharyngeal erythema.  Eyes:     General: No scleral icterus.       Right eye: No discharge.        Left eye: No discharge.     Extraocular Movements: Extraocular movements intact.     Conjunctiva/sclera: Conjunctivae normal.     Pupils: Pupils are equal, round, and reactive to light.  Neck:     Vascular: No carotid bruit.  Cardiovascular:     Rate and Rhythm: Normal rate and regular rhythm.     Heart sounds: Normal heart sounds. No murmur heard.    No friction rub. No gallop.  Pulmonary:     Effort: Pulmonary effort is normal. No respiratory distress.     Breath sounds:  Normal breath sounds. No stridor. No wheezing, rhonchi or rales.  Chest:     Chest wall: No tenderness.  Abdominal:     General: Bowel sounds are normal. There is no distension.     Palpations: Abdomen is soft.     Tenderness: There is no abdominal tenderness. There is no right CVA tenderness, left CVA tenderness, guarding or rebound.     Hernia: No hernia is present.  Musculoskeletal:        General: No swelling, tenderness, deformity or signs of injury.     Cervical back: Normal range of motion and neck supple. No rigidity or tenderness.     Right lower leg: No edema.     Left lower leg: No edema.  Lymphadenopathy:     Cervical: No cervical  adenopathy.  Skin:    General: Skin is warm.     Coloration: Skin is not jaundiced or pale.     Findings: No bruising, erythema, lesion or rash.  Neurological:     General: No focal deficit present.     Mental Status: She is alert and oriented to person, place, and time. Mental status is at baseline.     Cranial Nerves: No cranial nerve deficit.     Sensory: No sensory deficit.     Motor: No weakness.     Coordination: Coordination normal.     Gait: Gait normal.     Deep Tendon Reflexes: Reflexes normal.  Psychiatric:        Mood and Affect: Mood normal.        Behavior: Behavior normal.        Thought Content: Thought content normal.        Judgment: Judgment normal.          Assessment & Plan:  General medical exam  Benign essential HTN  Pure hypercholesterolemia  Prediabetes Blood work looks much better.  Prediabetes has improved.  Fatty liver disease has improved.  Continue to work on exercise and weight loss.  We discussed Mancel Parsons and she will check with her insurance.  She received her flu shot today.  I recommended a COVID booster.  Colon cancer screening is up-to-date.  Mammogram is up-to-date.  Patient will check with her gynecologist about her bone density test when she sees him.  She is taking calcium and vitamin D.

## 2022-02-12 NOTE — Addendum Note (Signed)
Addended by: Venia Carbon K on: 02/12/2022 11:20 AM   Modules accepted: Orders

## 2022-03-04 ENCOUNTER — Other Ambulatory Visit: Payer: Self-pay | Admitting: Family Medicine

## 2022-03-04 ENCOUNTER — Telehealth: Payer: Self-pay

## 2022-03-04 MED ORDER — NIRMATRELVIR/RITONAVIR (PAXLOVID)TABLET: 3.0000 | ORAL_TABLET | Freq: Two times a day (BID) | ORAL | 0 refills | Status: AC

## 2022-03-04 NOTE — Telephone Encounter (Signed)
Pt called and states she tested + for Covid this morning. Pt asks if Paxlovid could be sent in for her? Thank you.

## 2022-04-18 ENCOUNTER — Other Ambulatory Visit: Payer: Self-pay

## 2022-04-18 ENCOUNTER — Ambulatory Visit: Payer: Commercial Managed Care - PPO | Admitting: Family Medicine

## 2022-04-18 ENCOUNTER — Encounter: Payer: Self-pay | Admitting: Family Medicine

## 2022-04-18 VITALS — BP 138/90 | HR 70 | Temp 97.8°F | Ht 63.0 in | Wt 172.0 lb

## 2022-04-18 DIAGNOSIS — R1030 Lower abdominal pain, unspecified: Secondary | ICD-10-CM

## 2022-04-18 DIAGNOSIS — R3129 Other microscopic hematuria: Secondary | ICD-10-CM

## 2022-04-18 LAB — URINALYSIS, ROUTINE W REFLEX MICROSCOPIC
Bacteria, UA: NONE SEEN /HPF
Bilirubin Urine: NEGATIVE
Glucose, UA: NEGATIVE
Hyaline Cast: NONE SEEN /LPF
Ketones, ur: NEGATIVE
Leukocytes,Ua: NEGATIVE
Nitrite: NEGATIVE
Protein, ur: NEGATIVE
Specific Gravity, Urine: 1.02 (ref 1.001–1.035)
WBC, UA: NONE SEEN /HPF (ref 0–5)
pH: 6 (ref 5.0–8.0)

## 2022-04-18 LAB — MICROSCOPIC MESSAGE

## 2022-04-18 NOTE — Assessment & Plan Note (Signed)
Patient experiencing lower abdominal and lower back pain with polyuria. Urine dipstick shows positive for RBC's.  Micro exam: 3-10 RBC's per HPF. Will order CT abdomen pelvis to rule out kidney stones. Encouraged patient to seek medical care sooner for worsening severe abdominal pain, vomiting, fever.

## 2022-04-18 NOTE — Progress Notes (Signed)
Acute Office Visit  Subjective:     Patient ID: [name], female    DOB: 1965-04-16, 57 y.o.   MRN: [id number]  Chief Complaint  Patient presents with   Follow-up    UTI? Sx since 04/14/22. sx: lower back pain, frequency, slight odor; not feeling well    HPI Patient is in today for urinary frequency, low back pain, lower abdominal pain, chills, nausea since Sunday. He has a history of total abdominal hysterectomy as well as urinary frequency and incontinence with bladder sling. Denies fever, dysuria, hematuria, vomiting, diarrhea, constipation, bowel pattern changes. Has tried Tylenol  Review of Systems  All other systems reviewed and are negative.   Past Medical History:  Diagnosis Date   Hyperlipidemia    Hypertension    Metabolic syndrome    PONV (postoperative nausea and vomiting)    nausea even after zofran and decadron   Tracheal stenosis        Objective:    BP (!) 138/90   Pulse 70   Temp 97.8 F (36.6 C) (Oral)   Ht '5\' 3"'$  (1.6 m)   Wt 172 lb (78 kg)   SpO2 99%   BMI 30.47 kg/m    Physical Exam Vitals and nursing note reviewed.  Constitutional:      Appearance: Normal appearance. She is normal weight.  HENT:     Head: Normocephalic and atraumatic.  Cardiovascular:     Rate and Rhythm: Normal rate and regular rhythm.     Pulses: Normal pulses.     Heart sounds: Normal heart sounds.  Pulmonary:     Effort: Pulmonary effort is normal.     Breath sounds: Normal breath sounds.  Abdominal:     General: Bowel sounds are normal. There is no distension.     Palpations: Abdomen is soft.     Tenderness: There is abdominal tenderness in the right lower quadrant, suprapubic area and left lower quadrant. There is no right CVA tenderness, left CVA tenderness, guarding or rebound.  Skin:    General: Skin is warm and dry.  Neurological:     General: No focal deficit present.     Mental Status: She is alert and oriented to person, place, and time.  Mental status is at baseline.  Psychiatric:        Mood and Affect: Mood normal.        Behavior: Behavior normal.        Thought Content: Thought content normal.        Judgment: Judgment normal.     Results for orders placed or performed in visit on 04/18/22  Urinalysis, Routine w reflex microscopic  Result Value Ref Range   Color, Urine YELLOW YELLOW   APPearance CLEAR CLEAR   Specific Gravity, Urine 1.020 1.001 - 1.035   pH 6.0 5.0 - 8.0   Glucose, UA NEGATIVE NEGATIVE   Bilirubin Urine NEGATIVE NEGATIVE   Ketones, ur NEGATIVE NEGATIVE   Hgb urine dipstick 1+ (A) NEGATIVE   Protein, ur NEGATIVE NEGATIVE   Nitrite NEGATIVE NEGATIVE   Leukocytes,Ua NEGATIVE NEGATIVE   WBC, UA NONE SEEN 0 - 5 /HPF   RBC / HPF 3-10 (A) 0 - 2 /HPF   Squamous Epithelial / HPF 0-5 < OR = 5 /HPF   Bacteria, UA NONE SEEN NONE SEEN /HPF   Hyaline Cast NONE SEEN NONE SEEN /LPF  Microscopic Message  Result Value Ref Range   Note  Assessment & Plan:   Problem List Items Addressed This Visit       Other   Lower abdominal pain - Primary    Patient experiencing lower abdominal and lower back pain with polyuria. Urine dipstick shows positive for RBC's.  Micro exam: 3-10 RBC's per HPF. Will order CT abdomen pelvis to rule out kidney stones. Encouraged patient to seek medical care sooner for worsening severe abdominal pain, vomiting, fever.       Relevant Orders   CT Abdomen Pelvis Wo Contrast   COMPLETE METABOLIC PANEL WITH GFR   Uric acid   CBC with Differential/Platelet   Calcium, ionized   Other Visit Diagnoses     Other microscopic hematuria           No orders of the defined types were placed in this encounter.   Return if symptoms worsen or fail to improve.  Rubie Maid, FNP

## 2022-04-19 ENCOUNTER — Telehealth: Payer: Self-pay

## 2022-04-19 LAB — COMPLETE METABOLIC PANEL WITH GFR
AG Ratio: 1.5 (calc) (ref 1.0–2.5)
ALT: 26 U/L (ref 6–29)
AST: 17 U/L (ref 10–35)
Albumin: 4.4 g/dL (ref 3.6–5.1)
Alkaline phosphatase (APISO): 60 U/L (ref 37–153)
BUN: 22 mg/dL (ref 7–25)
CO2: 27 mmol/L (ref 20–32)
Calcium: 10 mg/dL (ref 8.6–10.4)
Chloride: 102 mmol/L (ref 98–110)
Creat: 0.66 mg/dL (ref 0.50–1.03)
Globulin: 2.9 g/dL (calc) (ref 1.9–3.7)
Glucose, Bld: 125 mg/dL — ABNORMAL HIGH (ref 65–99)
Potassium: 4.5 mmol/L (ref 3.5–5.3)
Sodium: 140 mmol/L (ref 135–146)
Total Bilirubin: 1.2 mg/dL (ref 0.2–1.2)
Total Protein: 7.3 g/dL (ref 6.1–8.1)
eGFR: 102 mL/min/{1.73_m2} (ref >=60)

## 2022-04-19 LAB — CBC WITH DIFFERENTIAL/PLATELET
Absolute Monocytes: 587 cells/uL (ref 200–950)
Basophils Absolute: 51 cells/uL (ref 0–200)
Basophils Relative: 0.6 %
Eosinophils Absolute: 51 cells/uL (ref 15–500)
Eosinophils Relative: 0.6 %
HCT: 43.4 % (ref 35.0–45.0)
Hemoglobin: 14.6 g/dL (ref 11.7–15.5)
Lymphs Abs: 2754 cells/uL (ref 850–3900)
MCH: 31.5 pg (ref 27.0–33.0)
MCHC: 33.6 g/dL (ref 32.0–36.0)
MCV: 93.5 fL (ref 80.0–100.0)
MPV: 9.9 fL (ref 7.5–12.5)
Monocytes Relative: 6.9 %
Neutro Abs: 5058 cells/uL (ref 1500–7800)
Neutrophils Relative %: 59.5 %
Platelets: 304 10*3/uL (ref 140–400)
RBC: 4.64 10*6/uL (ref 3.80–5.10)
RDW: 12.2 % (ref 11.0–15.0)
Total Lymphocyte: 32.4 %
WBC: 8.5 10*3/uL (ref 3.8–10.8)

## 2022-04-19 LAB — CALCIUM, IONIZED: Calcium, Ion: 5.1 mg/dL (ref 4.7–5.5)

## 2022-04-19 LAB — URIC ACID: Uric Acid, Serum: 5.3 mg/dL (ref 2.5–7.0)

## 2022-04-19 NOTE — Telephone Encounter (Signed)
Pt called and states that she would like to wait on the CT scan. Pt states her back is some better and wants to give it some time to see if it will improve on it's own. Thank you.

## 2022-04-30 ENCOUNTER — Encounter: Payer: Self-pay | Admitting: Family Medicine

## 2022-05-02 ENCOUNTER — Telehealth: Payer: Self-pay | Admitting: Family Medicine

## 2022-05-02 ENCOUNTER — Other Ambulatory Visit: Payer: Self-pay | Admitting: Family Medicine

## 2022-05-02 MED ORDER — WEGOVY 0.5 MG/0.5ML ~~LOC~~ SOAJ: 0.5000 mg | SUBCUTANEOUS | 3 refills | Status: DC

## 2022-05-07 NOTE — Telephone Encounter (Signed)
[employer] Rx benefits Form completed put up front to be fax   05/07/22

## 2022-05-15 ENCOUNTER — Telehealth: Payer: Self-pay

## 2022-05-15 NOTE — Telephone Encounter (Signed)
Fax received from [employer]. [name] is a product plan exclusion and is not covered by  patient's plan at all. Thank you.

## 2022-10-26 ENCOUNTER — Other Ambulatory Visit: Payer: Self-pay | Admitting: Family Medicine

## 2022-10-28 NOTE — Telephone Encounter (Signed)
Requested Prescriptions  Refused Prescriptions Disp Refills   losartan (COZAAR) 50 MG tablet [Pharmacy Med Name: LOSARTAN POTASSIUM 50 MG TAB] 180 tablet 3    Sig: TAKE 1 TABLET BY MOUTH TWICE A DAY     Cardiovascular:  Angiotensin Receptor Blockers Failed - 10/26/2022  6:40 AM      Failed - Cr in normal range and within 180 days    Creat  Date Value Ref Range Status  04/18/2022 0.66 0.50 - 1.03 mg/dL Final         Failed - K in normal range and within 180 days    Potassium  Date Value Ref Range Status  04/18/2022 4.5 3.5 - 5.3 mmol/L Final         Failed - Last BP in normal range    BP Readings from Last 1 Encounters:  04/18/22 (!) 138/90         Failed - Valid encounter within last 6 months    Recent Outpatient Visits           1 year ago Benign essential HTN   [facility] Donita Brooks, MD   1 year ago Benign essential HTN   [facility] Donita Brooks, MD   2 years ago Diarrhea of infectious origin   [place] [place] [facility] Pickard, Priscille Heidelberg, MD   2 years ago Diarrhea, unspecified type   [facility] Valentino Nose, NP   2 years ago Pure hypercholesterolemia   [facility] Pickard, Priscille Heidelberg, MD              Passed - Patient is not pregnant

## 2023-01-21 ENCOUNTER — Other Ambulatory Visit: Payer: Self-pay | Admitting: Family Medicine

## 2023-01-22 ENCOUNTER — Other Ambulatory Visit: Payer: Self-pay

## 2023-01-22 ENCOUNTER — Telehealth: Payer: Self-pay

## 2023-01-22 DIAGNOSIS — F411 Generalized anxiety disorder: Secondary | ICD-10-CM

## 2023-01-22 DIAGNOSIS — I10 Essential (primary) hypertension: Secondary | ICD-10-CM

## 2023-01-22 MED ORDER — HYDROCHLOROTHIAZIDE 25 MG PO TABS: 25.0000 mg | ORAL_TABLET | Freq: Every day | ORAL | 3 refills | Status: AC

## 2023-01-22 MED ORDER — ESCITALOPRAM OXALATE 10 MG PO TABS: 10.0000 mg | ORAL_TABLET | Freq: Every day | ORAL | 0 refills | Status: DC

## 2023-01-22 NOTE — Telephone Encounter (Signed)
Copied from CRM [id number]. Topic: Clinical - Medication Refill >> Jan 22, 2023  9:51 AM Joanette Gula wrote: Most Recent Primary Care Visit:  Provider: Park Meo  Department: [facility]  Visit Type: OFFICE VISIT  Date: 04/18/2022  Medication: escitalopram (LEXAPRO) 10 MG tablet   & hydrochlorothiazide (HYDRODIURIL) 25 MG tablet   Pt. States that she'll be out by time her appointment comes around.  Has the patient contacted their pharmacy? Yes (Agent: If no, request that the patient contact the pharmacy for the refill. If patient does not wish to contact the pharmacy document the reason why and proceed with request.) (Agent: If yes, when and what did the pharmacy advise?)  Is this the correct pharmacy for this prescription? Yes If no, delete pharmacy and type the correct one.  This is the patient's preferred pharmacy:  [facility] [id number] - [place], [place] - [address] [place] [address] Phone: [phone] Fax: [phone]   Has the prescription been filled recently? Yes  Is the patient out of the medication? Yes  Has the patient been seen for an appointment in the last year OR does the patient have an upcoming appointment? Yes  Can we respond through MyChart? Yes  Agent: Please be advised that Rx refills may take up to 3 business days. We ask that you follow-up with your pharmacy.

## 2023-01-23 NOTE — Telephone Encounter (Signed)
Requested by interface surescripts. Signed 01/22/23 . Receipt confirmed from pharmacy on 01/22/23 at 9:56 am . Duplicate request.  Requested Prescriptions  Refused Prescriptions Disp Refills   hydrochlorothiazide (HYDRODIURIL) 25 MG tablet [Pharmacy Med Name: hydroCHLOROthiazide 25 MG TABLET] 90 tablet 3    Sig: TAKE 1 TABLET BY MOUTH DAILY     Cardiovascular: Diuretics - Thiazide Failed - 01/21/2023  6:11 AM      Failed - Cr in normal range and within 180 days    Creat  Date Value Ref Range Status  04/18/2022 0.66 0.50 - 1.03 mg/dL Final         Failed - K in normal range and within 180 days    Potassium  Date Value Ref Range Status  04/18/2022 4.5 3.5 - 5.3 mmol/L Final         Failed - Na in normal range and within 180 days    Sodium  Date Value Ref Range Status  04/18/2022 140 135 - 146 mmol/L Final         Failed - Last BP in normal range    BP Readings from Last 1 Encounters:  04/18/22 (!) 138/90         Failed - Valid encounter within last 6 months    Recent Outpatient Visits           1 year ago Benign essential HTN   [facility] Donita Brooks, MD   1 year ago Benign essential HTN   [facility] Donita Brooks, MD   2 years ago Diarrhea of infectious origin   [facility] Pickard, Priscille Heidelberg, MD   2 years ago Diarrhea, unspecified type   [facility] Valentino Nose, NP   3 years ago Pure hypercholesterolemia   [facility] Pickard, Priscille Heidelberg, MD

## 2023-02-07 ENCOUNTER — Other Ambulatory Visit: Payer: Self-pay | Admitting: Family Medicine

## 2023-02-07 DIAGNOSIS — F411 Generalized anxiety disorder: Secondary | ICD-10-CM

## 2023-02-10 ENCOUNTER — Ambulatory Visit: Payer: Commercial Managed Care - PPO | Admitting: Family Medicine

## 2023-02-10 ENCOUNTER — Encounter: Payer: Self-pay | Admitting: Family Medicine

## 2023-02-10 VITALS — BP 126/88 | HR 71 | Temp 98.2°F | Ht 63.0 in | Wt 176.4 lb

## 2023-02-10 DIAGNOSIS — E7800 Pure hypercholesterolemia, unspecified: Secondary | ICD-10-CM | POA: Diagnosis not present

## 2023-02-10 DIAGNOSIS — I10 Essential (primary) hypertension: Secondary | ICD-10-CM

## 2023-02-10 DIAGNOSIS — R7303 Prediabetes: Secondary | ICD-10-CM | POA: Diagnosis not present

## 2023-02-10 DIAGNOSIS — Z0001 Encounter for general adult medical examination with abnormal findings: Secondary | ICD-10-CM

## 2023-02-10 DIAGNOSIS — Z0000 Encounter for general adult medical examination without abnormal findings: Secondary | ICD-10-CM

## 2023-02-10 NOTE — Progress Notes (Signed)
Subjective:    Patient ID: [name], female    DOB: Feb 20, 1965, 57 y.o.   MRN: [id number]  Patient is a very pleasant 57 year old Caucasian female here today for a physical exam.  She gets her mammogram and her pelvic exam performed at her gynecologist.  They are also performing her bone density test.  She is due for a flu shot and a COVID shot which she declines.  She has had the shingles vaccine.Marland Kitchen  Her blood pressure today is excellent.  Overall she doing with no concerns.  Past Medical History:  Diagnosis Date   Hyperlipidemia    Hypertension    Metabolic syndrome    PONV (postoperative nausea and vomiting)    nausea even after zofran and decadron   Tracheal stenosis    Past Surgical History:  Procedure Laterality Date   ADENOIDECTOMY     age 29   bladder sling surgery x 2     CLEFT PALATE REPAIR     infant   OTHER SURGICAL HISTORY  05/2014, 09/2014   Tracheal bronchoscopy and dilation - performed by Dr. Harriette Ohara at [facility]   ROBOTIC ASSISTED BILATERAL SALPINGO OOPHERECTOMY Bilateral 01/31/2015   Procedure: ROBOTIC ASSISTED BILATERAL SALPINGO OOPHORECTOMY AND POSSIBLE LYSIS OF ADHESIONS;  Surgeon: Adolphus Birchwood, MD;  Location: [facility];  Service: Gynecology;  Laterality: Bilateral;   TONSILLECTOMY     age 29   trachea laser and dilation x 2     trachea stenosis   TUBAL LIGATION     1992   tubes in ears as child     VAGINAL HYSTERECTOMY     partial 1995   Current Outpatient Medications on File Prior to Visit  Medication Sig Dispense Refill   ALPRAZolam (XANAX) 0.5 MG tablet Take 1 tablet (0.5 mg total) by mouth 3 (three) times daily as needed. 30 tablet 0   Ascorbic Acid (VITAMIN C) 1000 MG tablet Take 1,000 mg by mouth daily.     cholecalciferol (VITAMIN D3) 25 MCG (1000 UNIT) tablet Take 1,000 Units by mouth daily.     escitalopram (LEXAPRO) 10 MG tablet Take 1 tablet (10 mg total) by mouth daily. OFFICE VISIT NEEDED FOR ADDITIONAL REFILLS 30 tablet 0    fexofenadine (ALLEGRA) 180 MG tablet Take 1 tablet (180 mg total) by mouth daily. 30 tablet 11   fluticasone (FLONASE) 50 MCG/ACT nasal spray SPRAY TWO SPRAYS IN EACH NOSTRIL ONCE DAILY 16 g 6   hydrochlorothiazide (HYDRODIURIL) 25 MG tablet Take 1 tablet (25 mg total) by mouth daily. 90 tablet 3   losartan (COZAAR) 50 MG tablet Take 1 tablet (50 mg total) by mouth 2 (two) times daily. 180 tablet 3   metoprolol tartrate (LOPRESSOR) 50 MG tablet Take 1 tablet (50 mg total) by mouth 2 (two) times daily. 180 tablet 3   pravastatin (PRAVACHOL) 20 MG tablet Take 1 tablet (20 mg total) by mouth daily. 90 tablet 3   Probiotic Product (PROBIOTIC ADVANCED PO) Take by mouth.     Semaglutide-Weight Management (WEGOVY) 0.5 MG/0.5ML SOAJ Inject 0.5 mg into the skin once a week. 2 mL 3   No current facility-administered medications on file prior to visit.   Allergies  Allergen Reactions   Niacin And Related Other (See Comments)    Passes out   Social History   Socioeconomic History   Marital status: Married    Spouse name: Not on file   Number of children: 2   Years of education: Not on  file   Highest education level: Not on file  Occupational History   Occupation: Print production planner  Tobacco Use   Smoking status: Never   Smokeless tobacco: Never  Substance and Sexual Activity   Alcohol use: Yes    Alcohol/week: 0.0 standard drinks of alcohol    Comment: 1-2 times in one year    Drug use: No   Sexual activity: Yes  Other Topics Concern   Not on file  Social History Narrative   Not on file   Social Drivers of Health   Financial Resource Strain: Not on file  Food Insecurity: Not on file  Transportation Needs: Not on file  Physical Activity: Not on file  Stress: Not on file  Social Connections: Not on file  Intimate Partner Violence: Not on file     Review of Systems  All other systems reviewed and are negative.      Objective:   Physical Exam Vitals reviewed.  Constitutional:       General: She is not in acute distress.    Appearance: Normal appearance. She is normal weight. She is not ill-appearing, toxic-appearing or diaphoretic.  HENT:     Head: Normocephalic and atraumatic.     Right Ear: Tympanic membrane and ear canal normal. There is no impacted cerumen.     Left Ear: Tympanic membrane and ear canal normal. There is no impacted cerumen.     Nose: Nose normal. No congestion or rhinorrhea.     Mouth/Throat:     Mouth: Mucous membranes are moist.     Pharynx: Oropharynx is clear. No oropharyngeal exudate or posterior oropharyngeal erythema.  Eyes:     General: No scleral icterus.       Right eye: No discharge.        Left eye: No discharge.     Extraocular Movements: Extraocular movements intact.     Conjunctiva/sclera: Conjunctivae normal.     Pupils: Pupils are equal, round, and reactive to light.  Neck:     Vascular: No carotid bruit.  Cardiovascular:     Rate and Rhythm: Normal rate and regular rhythm.     Heart sounds: Normal heart sounds. No murmur heard.    No friction rub. No gallop.  Pulmonary:     Effort: Pulmonary effort is normal. No respiratory distress.     Breath sounds: Normal breath sounds. No stridor. No wheezing, rhonchi or rales.  Chest:     Chest wall: No tenderness.  Abdominal:     General: Bowel sounds are normal. There is no distension.     Palpations: Abdomen is soft.     Tenderness: There is no abdominal tenderness. There is no right CVA tenderness, left CVA tenderness, guarding or rebound.     Hernia: No hernia is present.  Musculoskeletal:        General: No swelling, tenderness, deformity or signs of injury.     Cervical back: Normal range of motion and neck supple. No rigidity or tenderness.     Right lower leg: No edema.     Left lower leg: No edema.  Lymphadenopathy:     Cervical: No cervical adenopathy.  Skin:    General: Skin is warm.     Coloration: Skin is not jaundiced or pale.     Findings: No bruising,  erythema, lesion or rash.  Neurological:     General: No focal deficit present.     Mental Status: She is alert and oriented to person, place, and time.  Mental status is at baseline.     Cranial Nerves: No cranial nerve deficit.     Sensory: No sensory deficit.     Motor: No weakness.     Coordination: Coordination normal.     Gait: Gait normal.     Deep Tendon Reflexes: Reflexes normal.  Psychiatric:        Mood and Affect: Mood normal.        Behavior: Behavior normal.        Thought Content: Thought content normal.        Judgment: Judgment normal.           Assessment & Plan:  Prediabetes - Plan: Hemoglobin A1c, COMPLETE METABOLIC PANEL WITH GFR, CBC with Differential/Platelet, Lipid panel  Pure hypercholesterolemia - Plan: Hemoglobin A1c, COMPLETE METABOLIC PANEL WITH GFR, CBC with Differential/Platelet, Lipid panel  Benign essential HTN  General medical exam Patient's physical exam today is normal.  Her blood pressure is excellent.  I will check a CBC CMP lipid panel and an A1c to monitor her prediabetes.  Her blood pressure medication can be refilled for 1 year.  Mammogram is up-to-date.  Pelvic exam is up-to-date.  Patient is due for Cologuard July 2025.

## 2023-02-11 LAB — COMPLETE METABOLIC PANEL WITH GFR
AG Ratio: 1.9 (calc) (ref 1.0–2.5)
ALT: 25 U/L (ref 6–29)
AST: 18 U/L (ref 10–35)
Albumin: 4.7 g/dL (ref 3.6–5.1)
Alkaline phosphatase (APISO): 71 U/L (ref 37–153)
BUN: 23 mg/dL (ref 7–25)
CO2: 27 mmol/L (ref 20–32)
Calcium: 10.2 mg/dL (ref 8.6–10.4)
Chloride: 101 mmol/L (ref 98–110)
Creat: 0.59 mg/dL (ref 0.50–1.03)
Globulin: 2.5 g/dL (ref 1.9–3.7)
Glucose, Bld: 148 mg/dL — ABNORMAL HIGH (ref 65–99)
Potassium: 3.7 mmol/L (ref 3.5–5.3)
Sodium: 140 mmol/L (ref 135–146)
Total Bilirubin: 0.8 mg/dL (ref 0.2–1.2)
Total Protein: 7.2 g/dL (ref 6.1–8.1)
eGFR: 104 mL/min/{1.73_m2} (ref >=60)

## 2023-02-11 LAB — CBC WITH DIFFERENTIAL/PLATELET
Absolute Lymphocytes: 2900 {cells}/uL (ref 850–3900)
Absolute Monocytes: 543 {cells}/uL (ref 200–950)
Basophils Absolute: 49 {cells}/uL (ref 0–200)
Basophils Relative: 0.6 %
Eosinophils Absolute: 81 {cells}/uL (ref 15–500)
Eosinophils Relative: 1 %
HCT: 43.6 % (ref 35.0–45.0)
Hemoglobin: 14.5 g/dL (ref 11.7–15.5)
MCH: 31.5 pg (ref 27.0–33.0)
MCHC: 33.3 g/dL (ref 32.0–36.0)
MCV: 94.6 fL (ref 80.0–100.0)
MPV: 9.7 fL (ref 7.5–12.5)
Monocytes Relative: 6.7 %
Neutro Abs: 4528 {cells}/uL (ref 1500–7800)
Neutrophils Relative %: 55.9 %
Platelets: 317 10*3/uL (ref 140–400)
RBC: 4.61 10*6/uL (ref 3.80–5.10)
RDW: 12.4 % (ref 11.0–15.0)
Total Lymphocyte: 35.8 %
WBC: 8.1 10*3/uL (ref 3.8–10.8)

## 2023-02-11 LAB — HEMOGLOBIN A1C
Hgb A1c MFr Bld: 6.6 %{Hb} — ABNORMAL HIGH (ref <5.7)
Mean Plasma Glucose: 143 mg/dL
eAG (mmol/L): 7.9 mmol/L

## 2023-02-11 LAB — LIPID PANEL
Cholesterol: 161 mg/dL (ref <200)
HDL: 43 mg/dL — ABNORMAL LOW (ref >=50)
LDL Cholesterol (Calc): 93 mg/dL
Non-HDL Cholesterol (Calc): 118 mg/dL (ref <130)
Total CHOL/HDL Ratio: 3.7 (calc) (ref <5.0)
Triglycerides: 145 mg/dL (ref <150)

## 2023-02-14 ENCOUNTER — Other Ambulatory Visit: Payer: Self-pay

## 2023-02-14 DIAGNOSIS — E119 Type 2 diabetes mellitus without complications: Secondary | ICD-10-CM

## 2023-02-14 MED ORDER — OZEMPIC (0.25 OR 0.5 MG/DOSE) 2 MG/3ML ~~LOC~~ SOPN
0.2500 mg | PEN_INJECTOR | SUBCUTANEOUS | 1 refills | Status: DC
Start: 2023-02-14 — End: 2023-06-19

## 2023-02-21 ENCOUNTER — Telehealth: Payer: Self-pay | Admitting: Family Medicine

## 2023-02-21 NOTE — Telephone Encounter (Signed)
 Prior auth needed for Semaglutide,0.25 or 0.5MG /DOS, (OZEMPIC, 0.25 OR 0.5 MG/DOSE,) 2 MG/3ML SOPN Key is [id number]

## 2023-02-25 NOTE — Telephone Encounter (Signed)
 PA-Ozempic put up front w/notes and labs to be faxed to [facility] for approval

## 2023-02-26 NOTE — Telephone Encounter (Signed)
 Called and spoke w/pt re: PA-Ozempic   Told pt per her insurance card it listed [facility] as to where her PA's will be going to. Also told pt that per our CoverMyMeds PA system it was also going to [facility] as well. Pt stated that it should be going to a different place.   I told pt to call [employer] and find out where we should process there PA's through if it is not w/[facility] and let us  know via pt's MyChart or msg.   Pt voiced understanding and will call her insurance to find out.

## 2023-02-27 ENCOUNTER — Ambulatory Visit: Payer: Self-pay | Admitting: Family Medicine

## 2023-02-27 DIAGNOSIS — E119 Type 2 diabetes mellitus without complications: Secondary | ICD-10-CM | POA: Insufficient documentation

## 2023-02-27 NOTE — Telephone Encounter (Signed)
 Copied from CRM [id number]. Topic: Clinical - Prescription Issue >> Feb 27, 2023  1:20 PM Carlatta H wrote: Reason for CRM: Patient called and stated that the pre authorization should be sent to [email or web address] not [email or web address]//Please call patient because medication was denied again//   Chief Complaint: Prior Authorization  Disposition: [] ED /[] Urgent Care (no appt availability in office) / [] Appointment(In office/virtual)/ []  [place] [facility]/ [] Home Care/ [] Refused Recommended Disposition /[] [facility]/ [x]  Follow-up with PCP Additional Notes: Patient is needing Prior Authorization done, the prior authorization form is needing to be completed online. CAL connected for further assistance.   Reason for Disposition  [1] Caller requesting NON-URGENT health information AND [2] PCP's office is the best resource  Answer Assessment - Initial Assessment Questions 1. REASON FOR CALL or QUESTION: What is your reason for calling today? or How can I best help you? or What question do you have that I can help answer?     Prior Authorization  Protocols used: Information Only Call - No Triage-A-AH

## 2023-03-06 ENCOUNTER — Other Ambulatory Visit: Payer: Self-pay | Admitting: Family Medicine

## 2023-03-06 DIAGNOSIS — F411 Generalized anxiety disorder: Secondary | ICD-10-CM

## 2023-03-16 ENCOUNTER — Other Ambulatory Visit: Payer: Self-pay | Admitting: Family Medicine

## 2023-03-29 ENCOUNTER — Other Ambulatory Visit: Payer: Self-pay | Admitting: Family Medicine

## 2023-03-31 ENCOUNTER — Other Ambulatory Visit: Payer: Self-pay | Admitting: Family Medicine

## 2023-03-31 ENCOUNTER — Other Ambulatory Visit: Payer: Self-pay

## 2023-03-31 DIAGNOSIS — I10 Essential (primary) hypertension: Secondary | ICD-10-CM

## 2023-03-31 MED ORDER — METOPROLOL TARTRATE 50 MG PO TABS: 50.0000 mg | ORAL_TABLET | Freq: Two times a day (BID) | ORAL | 1 refills | Status: DC

## 2023-03-31 NOTE — Telephone Encounter (Signed)
 Copied from CRM [id number]. Topic: Clinical - Prescription Issue >> Mar 31, 2023 10:29 AM [name] wrote: Reason for CRM: Patient was trying to get refill on  metoprolol  tartrate (LOPRESSOR ) 50 MG tablet and the pharmacy told her it was denied because she needs to see the doctor first. Patient's last office visit was in December so she is confused on why she needs an appointment so soon. Please advise patient

## 2023-04-13 ENCOUNTER — Other Ambulatory Visit: Payer: Self-pay | Admitting: Family Medicine

## 2023-04-20 ENCOUNTER — Other Ambulatory Visit: Payer: Self-pay | Admitting: Family Medicine

## 2023-05-12 ENCOUNTER — Other Ambulatory Visit: Payer: Self-pay | Admitting: Family Medicine

## 2023-05-13 NOTE — Telephone Encounter (Signed)
 Requested medication (s) are due for refill today: yes   Requested medication (s) are on the active medication list: yes   Last refill:  04/14/23 #30 0 refills  Future visit scheduled: no   Notes to clinic:  last OV / labs 02/10/23. No refills remain. Do you want to refill Rx?     Requested Prescriptions  Pending Prescriptions Disp Refills   pravastatin (PRAVACHOL) 20 MG tablet [Pharmacy Med Name: PRAVASTATIN SODIUM 20 MG TAB] 30 tablet 0    Sig: TAKE 1 TABLET BY MOUTH DAILY     Cardiovascular:  Antilipid - Statins Failed - 05/13/2023 11:18 AM      Failed - Valid encounter within last 12 months    Recent Outpatient Visits           2 years ago Benign essential HTN   [facility] Donita Brooks, MD   2 years ago Benign essential HTN   [facility] Donita Brooks, MD   2 years ago Diarrhea of infectious origin   [facility] Pickard, Priscille Heidelberg, MD   2 years ago Diarrhea, unspecified type   [facility] Valentino Nose, NP   3 years ago Pure hypercholesterolemia   [facility] Pickard, Priscille Heidelberg, MD              Failed - Lipid Panel in normal range within the last 12 months    Cholesterol  Date Value Ref Range Status  02/10/2023 161 <200 mg/dL Final   LDL Cholesterol (Calc)  Date Value Ref Range Status  02/10/2023 93 mg/dL (calc) Final    Comment:    Reference range: <100 . Desirable range <100 mg/dL for primary prevention;   <70 mg/dL for patients with CHD or diabetic patients  with > or = 2 CHD risk factors. Marland Kitchen LDL-C is now calculated using the Martin-Hopkins  calculation, which is a validated novel method providing  better accuracy than the Friedewald equation in the  estimation of LDL-C.  Horald Pollen et al. Lenox Ahr. 1610;960(45): 2061-2068  (http://education.[facility].com/faq/FAQ164)    HDL  Date Value Ref Range Status  02/10/2023 43 (L) > OR = 50 mg/dL Final    Triglycerides  Date Value Ref Range Status  02/10/2023 145 <150 mg/dL Final         Passed - Patient is not pregnant

## 2023-06-13 ENCOUNTER — Other Ambulatory Visit: Payer: Self-pay | Admitting: Family Medicine

## 2023-06-13 NOTE — Telephone Encounter (Signed)
 Requested Prescriptions  Pending Prescriptions Disp Refills   pravastatin  (PRAVACHOL ) 20 MG tablet [Pharmacy Med Name: PRAVASTATIN  SODIUM 20 MG TAB] 30 tablet 0    Sig: TAKE 1 TABLET BY MOUTH DAILY     Cardiovascular:  Antilipid - Statins Failed - 06/13/2023  2:42 PM      Failed - Valid encounter within last 12 months    Recent Outpatient Visits           4 months ago Prediabetes   [facility] Pickard, Cisco Crest, MD   1 year ago Lower abdominal pain   [place] [facility] Jenelle Mis, FNP   1 year ago General medical exam   [place] [facility] Austine Lefort, MD              Failed - Lipid Panel in normal range within the last 12 months    Cholesterol  Date Value Ref Range Status  02/10/2023 161 <200 mg/dL Final   LDL Cholesterol (Calc)  Date Value Ref Range Status  02/10/2023 93 mg/dL (calc) Final    Comment:    Reference range: <100 . Desirable range <100 mg/dL for primary prevention;   <70 mg/dL for patients with CHD or diabetic patients  with > or = 2 CHD risk factors. Aaron Aas LDL-C is now calculated using the Martin-Hopkins  calculation, which is a validated novel method providing  better accuracy than the Friedewald equation in the  estimation of LDL-C.  Melinda Sprawls et al. Erroll Heard. 1610;960(45): 2061-2068  (http://education.[facility].com/faq/FAQ164)    HDL  Date Value Ref Range Status  02/10/2023 43 (L) > OR = 50 mg/dL Final   Triglycerides  Date Value Ref Range Status  02/10/2023 145 <150 mg/dL Final         Passed - Patient is not pregnant

## 2023-06-19 ENCOUNTER — Other Ambulatory Visit: Payer: Self-pay | Admitting: Family Medicine

## 2023-06-19 DIAGNOSIS — E119 Type 2 diabetes mellitus without complications: Secondary | ICD-10-CM

## 2023-06-28 ENCOUNTER — Other Ambulatory Visit: Payer: Self-pay | Admitting: Family Medicine

## 2023-06-28 DIAGNOSIS — I10 Essential (primary) hypertension: Secondary | ICD-10-CM

## 2023-06-30 NOTE — Telephone Encounter (Signed)
 Per last OV -RF BP medication for 1 year- Rx adjusted per note Requested Prescriptions  Pending Prescriptions Disp Refills   metoprolol  tartrate (LOPRESSOR ) 50 MG tablet [Pharmacy Med Name: METOPROLOL  TARTRATE 50 MG TAB] 180 tablet     Sig: TAKE 1 TABLET BY MOUTH 2 TIMES A DAY     Cardiovascular:  Beta Blockers Failed - 06/30/2023  4:28 PM      Failed - Valid encounter within last 6 months    Recent Outpatient Visits           4 months ago Prediabetes   [facility] Pickard, Cisco Crest, MD   1 year ago Lower abdominal pain   [facility] Jenelle Mis, FNP   1 year ago General medical exam    [facility] Austine Lefort, MD              Passed - Last BP in normal range    BP Readings from Last 1 Encounters:  02/10/23 126/88         Passed - Last Heart Rate in normal range    Pulse Readings from Last 1 Encounters:  02/10/23 71

## 2023-07-11 ENCOUNTER — Other Ambulatory Visit: Payer: Self-pay | Admitting: Family Medicine

## 2023-07-24 ENCOUNTER — Other Ambulatory Visit

## 2023-07-24 DIAGNOSIS — I10 Essential (primary) hypertension: Secondary | ICD-10-CM

## 2023-07-24 DIAGNOSIS — E7800 Pure hypercholesterolemia, unspecified: Secondary | ICD-10-CM

## 2023-07-24 DIAGNOSIS — E119 Type 2 diabetes mellitus without complications: Secondary | ICD-10-CM

## 2023-07-25 ENCOUNTER — Ambulatory Visit: Payer: Self-pay | Admitting: Family Medicine

## 2023-07-25 LAB — CBC WITH DIFFERENTIAL/PLATELET
Absolute Lymphocytes: 2868 {cells}/uL (ref 850–3900)
Absolute Monocytes: 679 {cells}/uL (ref 200–950)
Basophils Absolute: 47 {cells}/uL (ref 0–200)
Basophils Relative: 0.6 %
Eosinophils Absolute: 63 {cells}/uL (ref 15–500)
Eosinophils Relative: 0.8 %
HCT: 43.8 % (ref 35.0–45.0)
Hemoglobin: 14.2 g/dL (ref 11.7–15.5)
MCH: 31.6 pg (ref 27.0–33.0)
MCHC: 32.4 g/dL (ref 32.0–36.0)
MCV: 97.6 fL (ref 80.0–100.0)
MPV: 9.4 fL (ref 7.5–12.5)
Monocytes Relative: 8.6 %
Neutro Abs: 4242 {cells}/uL (ref 1500–7800)
Neutrophils Relative %: 53.7 %
Platelets: 298 10*3/uL (ref 140–400)
RBC: 4.49 10*6/uL (ref 3.80–5.10)
RDW: 12.6 % (ref 11.0–15.0)
Total Lymphocyte: 36.3 %
WBC: 7.9 10*3/uL (ref 3.8–10.8)

## 2023-07-25 LAB — MICROALBUMIN / CREATININE URINE RATIO
Creatinine, Urine: 124 mg/dL (ref 20–275)
Microalb Creat Ratio: 4 mg/g{creat} (ref <30)
Microalb, Ur: 0.5 mg/dL

## 2023-07-25 LAB — COMPLETE METABOLIC PANEL WITHOUT GFR
AG Ratio: 2 (calc) (ref 1.0–2.5)
ALT: 25 U/L (ref 6–29)
AST: 16 U/L (ref 10–35)
Albumin: 4.5 g/dL (ref 3.6–5.1)
Alkaline phosphatase (APISO): 65 U/L (ref 37–153)
BUN: 22 mg/dL (ref 7–25)
CO2: 29 mmol/L (ref 20–32)
Calcium: 9.5 mg/dL (ref 8.6–10.4)
Chloride: 102 mmol/L (ref 98–110)
Creat: 0.68 mg/dL (ref 0.50–1.03)
Globulin: 2.3 g/dL (ref 1.9–3.7)
Glucose, Bld: 118 mg/dL — ABNORMAL HIGH (ref 65–99)
Potassium: 4.5 mmol/L (ref 3.5–5.3)
Sodium: 140 mmol/L (ref 135–146)
Total Bilirubin: 0.9 mg/dL (ref 0.2–1.2)
Total Protein: 6.8 g/dL (ref 6.1–8.1)

## 2023-07-25 LAB — LIPID PANEL
Cholesterol: 152 mg/dL (ref <200)
HDL: 40 mg/dL — ABNORMAL LOW (ref >=50)
LDL Cholesterol (Calc): 87 mg/dL
Non-HDL Cholesterol (Calc): 112 mg/dL (ref <130)
Total CHOL/HDL Ratio: 3.8 (calc) (ref <5.0)
Triglycerides: 149 mg/dL (ref <150)

## 2023-07-25 LAB — HEMOGLOBIN A1C
Hgb A1c MFr Bld: 6.1 % — ABNORMAL HIGH (ref <5.7)
Mean Plasma Glucose: 128 mg/dL
eAG (mmol/L): 7.1 mmol/L

## 2023-07-25 LAB — VITAMIN B12: Vitamin B-12: 477 pg/mL (ref 200–1100)

## 2023-07-31 ENCOUNTER — Encounter: Payer: Self-pay | Admitting: Family Medicine

## 2023-07-31 ENCOUNTER — Ambulatory Visit: Admitting: Family Medicine

## 2023-07-31 ENCOUNTER — Ambulatory Visit (INDEPENDENT_AMBULATORY_CARE_PROVIDER_SITE_OTHER)

## 2023-07-31 VITALS — BP 138/82 | HR 78 | Ht 63.0 in | Wt 175.8 lb

## 2023-07-31 DIAGNOSIS — Z1211 Encounter for screening for malignant neoplasm of colon: Secondary | ICD-10-CM | POA: Diagnosis not present

## 2023-07-31 DIAGNOSIS — E119 Type 2 diabetes mellitus without complications: Secondary | ICD-10-CM | POA: Diagnosis not present

## 2023-07-31 DIAGNOSIS — E7800 Pure hypercholesterolemia, unspecified: Secondary | ICD-10-CM

## 2023-07-31 DIAGNOSIS — Z7985 Long-term (current) use of injectable non-insulin antidiabetic drugs: Secondary | ICD-10-CM

## 2023-07-31 DIAGNOSIS — I10 Essential (primary) hypertension: Secondary | ICD-10-CM | POA: Diagnosis not present

## 2023-07-31 LAB — HM DIABETES EYE EXAM

## 2023-07-31 MED ORDER — OZEMPIC (0.25 OR 0.5 MG/DOSE) 2 MG/3ML ~~LOC~~ SOPN: 0.5000 mg | PEN_INJECTOR | SUBCUTANEOUS | 11 refills | Status: DC

## 2023-07-31 NOTE — Progress Notes (Signed)
 [name] arrived 07/31/2023 and has given verbal consent to obtain images and complete their overdue diabetic retinal screening.  The images have been sent to an ophthalmologist or optometrist for review and interpretation.  Results will be sent back to Austine Lefort, MD for review.  Patient has been informed they will be contacted when we receive the results via telephone or MyChart  Vallerie Gave, CMA

## 2023-07-31 NOTE — Progress Notes (Signed)
 Subjective:    Patient ID: [name], female    DOB: 25-May-1965, 58 y.o.   MRN: [id number]  Patient is currently on Ozempic  0.25 mg subcu weekly for her diabetes.  Her most recent lab work shows a hemoglobin A1c of 6.1.  Her cholesterol is excellent. Lab on 07/24/2023  Component Date Value Ref Range Status   WBC 07/24/2023 7.9  3.8 - 10.8 Thousand/uL Final   RBC 07/24/2023 4.49  3.80 - 5.10 Million/uL Final   Hemoglobin 07/24/2023 14.2  11.7 - 15.5 g/dL Final   HCT 47/82/9562 43.8  35.0 - 45.0 % Final   MCV 07/24/2023 97.6  80.0 - 100.0 fL Final   MCH 07/24/2023 31.6  27.0 - 33.0 pg Final   MCHC 07/24/2023 32.4  32.0 - 36.0 g/dL Final   Comment: For adults, a slight decrease in the calculated MCHC value (in the range of 30 to 32 g/dL) is most likely not clinically significant; however, it should be interpreted with caution in correlation with other red cell parameters and the patient's clinical condition.    RDW 07/24/2023 12.6  11.0 - 15.0 % Final   Platelets 07/24/2023 298  140 - 400 Thousand/uL Final   MPV 07/24/2023 9.4  7.5 - 12.5 fL Final   Neutro Abs 07/24/2023 4,242  1,500 - 7,800 cells/uL Final   Absolute Lymphocytes 07/24/2023 2,868  850 - 3,900 cells/uL Final   Absolute Monocytes 07/24/2023 679  200 - 950 cells/uL Final   Eosinophils Absolute 07/24/2023 63  15 - 500 cells/uL Final   Basophils Absolute 07/24/2023 47  0 - 200 cells/uL Final   Neutrophils Relative % 07/24/2023 53.7  % Final   Total Lymphocyte 07/24/2023 36.3  % Final   Monocytes Relative 07/24/2023 8.6  % Final   Eosinophils Relative 07/24/2023 0.8  % Final   Basophils Relative 07/24/2023 0.6  % Final   Glucose, Bld 07/24/2023 118 (H)  65 - 99 mg/dL Final   Comment: .            Fasting reference interval . For someone without known diabetes, a glucose value between 100 and 125 mg/dL is consistent with prediabetes and should be confirmed with a follow-up test. .    BUN 07/24/2023 22  7 - 25  mg/dL Final   Creat 13/09/6576 0.68  0.50 - 1.03 mg/dL Final   BUN/Creatinine Ratio 07/24/2023 SEE NOTE:  6 - 22 (calc) Final   Comment:    Not Reported: BUN and Creatinine are within    reference range. .    Sodium 07/24/2023 140  135 - 146 mmol/L Final   Potassium 07/24/2023 4.5  3.5 - 5.3 mmol/L Final   Chloride 07/24/2023 102  98 - 110 mmol/L Final   CO2 07/24/2023 29  20 - 32 mmol/L Final   Calcium 07/24/2023 9.5  8.6 - 10.4 mg/dL Final   Total Protein 46/96/2952 6.8  6.1 - 8.1 g/dL Final   Albumin 84/13/2440 4.5  3.6 - 5.1 g/dL Final   Globulin 12/15/2534 2.3  1.9 - 3.7 g/dL (calc) Final   AG Ratio 07/24/2023 2.0  1.0 - 2.5 (calc) Final   Total Bilirubin 07/24/2023 0.9  0.2 - 1.2 mg/dL Final   Alkaline phosphatase (APISO) 07/24/2023 65  37 - 153 U/L Final   AST 07/24/2023 16  10 - 35 U/L Final   ALT 07/24/2023 25  6 - 29 U/L Final   Hgb A1c MFr Bld 07/24/2023 6.1 (H)  <5.7 %  Final   Comment: For someone without known diabetes, a hemoglobin  A1c value between 5.7% and 6.4% is consistent with prediabetes and should be confirmed with a  follow-up test. . For someone with known diabetes, a value <7% indicates that their diabetes is well controlled. A1c targets should be individualized based on duration of diabetes, age, comorbid conditions, and other considerations. . This assay result is consistent with an increased risk of diabetes. . Currently, no consensus exists regarding use of hemoglobin A1c for diagnosis of diabetes for children. .    Mean Plasma Glucose 07/24/2023 128  mg/dL Final   eAG (mmol/L) 40/98/1191 7.1  mmol/L Final   Cholesterol 07/24/2023 152  <200 mg/dL Final   HDL 47/82/9562 40 (L)  > OR = 50 mg/dL Final   Triglycerides 13/09/6576 149  <150 mg/dL Final   LDL Cholesterol (Calc) 07/24/2023 87  mg/dL (calc) Final   Comment: Reference range: <100 . Desirable range <100 mg/dL for primary prevention;   <70 mg/dL for patients with CHD or diabetic  patients  with > or = 2 CHD risk factors. Aaron Aas LDL-C is now calculated using the Martin-Hopkins  calculation, which is a validated novel method providing  better accuracy than the Friedewald equation in the  estimation of LDL-C.  Melinda Sprawls et al. Erroll Heard. 4696;295(28): 2061-2068  (http://education.[facility].com/faq/FAQ164)    Total CHOL/HDL Ratio 07/24/2023 3.8  <4.1 (calc) Final   Non-HDL Cholesterol (Calc) 07/24/2023 112  <130 mg/dL (calc) Final   Comment: For patients with diabetes plus 1 major ASCVD risk  factor, treating to a non-HDL-C goal of <100 mg/dL  (LDL-C of <32 mg/dL) is considered a therapeutic  option.    Creatinine, Urine 07/24/2023 124  20 - 275 mg/dL Final   Microalb, Ur 44/02/270 0.5  mg/dL Final   Comment: Reference Range Not established    Microalb Creat Ratio 07/24/2023 4  <30 mg/g creat Final   Comment: . The ADA defines abnormalities in albumin excretion as follows: Aaron Aas Albuminuria Category        Result (mg/g creatinine) . Normal to Mildly increased   <30 Moderately increased         30-299  Severely increased           > OR = 300 . The ADA recommends that at least two of three specimens collected within a 3-6 month period be abnormal before considering a patient to be within a diagnostic category.    Vitamin B-12 07/24/2023 477  200 - 1,100 pg/mL Final   She is overdue for colonoscopy.  She would like to do Cologuard.  Her mammogram is inform her gynecologist.  She is due for pneumonia vaccine.  She defers this at the present time. Past Medical History:  Diagnosis Date   Hyperlipidemia    Hypertension    Metabolic syndrome    PONV (postoperative nausea and vomiting)    nausea even after zofran  and decadron    Tracheal stenosis    Past Surgical History:  Procedure Laterality Date   ADENOIDECTOMY     age 48   bladder sling surgery x 2     CLEFT PALATE REPAIR     infant   OTHER SURGICAL HISTORY  05/2014, 09/2014   Tracheal bronchoscopy and  dilation - performed by Dr. Kimberly Penna at [place]   ROBOTIC ASSISTED BILATERAL SALPINGO OOPHERECTOMY Bilateral 01/31/2015   Procedure: ROBOTIC ASSISTED BILATERAL SALPINGO OOPHORECTOMY AND POSSIBLE LYSIS OF ADHESIONS;  Surgeon: Alphonso Aschoff, MD;  Location: [facility];  Service: Gynecology;  Laterality: Bilateral;   TONSILLECTOMY     age 85   trachea laser and dilation x 2     trachea stenosis   TUBAL LIGATION     1992   tubes in ears as child     VAGINAL HYSTERECTOMY     partial 1995   Current Outpatient Medications on File Prior to Visit  Medication Sig Dispense Refill   ALPRAZolam  (XANAX ) 0.5 MG tablet Take 1 tablet (0.5 mg total) by mouth 3 (three) times daily as needed. 30 tablet 0   Ascorbic Acid (VITAMIN C) 1000 MG tablet Take 1,000 mg by mouth daily.     cholecalciferol (VITAMIN D3) 25 MCG (1000 UNIT) tablet Take 1,000 Units by mouth daily.     escitalopram  (LEXAPRO ) 10 MG tablet Take 1 tablet (10 mg total) by mouth daily. 90 tablet 1   fexofenadine  (ALLEGRA ) 180 MG tablet Take 1 tablet (180 mg total) by mouth daily. 30 tablet 11   fluticasone  (FLONASE ) 50 MCG/ACT nasal spray SPRAY TWO SPRAYS IN EACH NOSTRIL ONCE DAILY 16 g 6   hydrochlorothiazide  (HYDRODIURIL ) 25 MG tablet Take 1 tablet (25 mg total) by mouth daily. 90 tablet 3   losartan  (COZAAR ) 50 MG tablet TAKE 1 TABLET BY MOUTH TWICE A DAY 180 tablet 3   metoprolol  tartrate (LOPRESSOR ) 50 MG tablet TAKE 1 TABLET BY MOUTH 2 TIMES A DAY 180 tablet 1   OZEMPIC , 0.25 OR 0.5 MG/DOSE, 2 MG/3ML SOPN DIAL AND INJECT UNDER THE SKIN 0.25 MG ONCE A WEEK AS DIRECTED 3 mL 1   pravastatin  (PRAVACHOL ) 20 MG tablet Take 1 tablet (20 mg total) by mouth daily. Appointment needed prior to additional refills. 30 tablet 0   Probiotic Product (PROBIOTIC ADVANCED PO) Take by mouth.     No current facility-administered medications on file prior to visit.   Allergies  Allergen Reactions   Niacin And Related Other (See Comments)    Passes out    Social History   Socioeconomic History   Marital status: Married    Spouse name: Not on file   Number of children: 2   Years of education: Not on file   Highest education level: Not on file  Occupational History   Occupation: Print production planner  Tobacco Use   Smoking status: Never   Smokeless tobacco: Never  Substance and Sexual Activity   Alcohol use: Yes    Alcohol/week: 0.0 standard drinks of alcohol    Comment: 1-2 times in one year    Drug use: No   Sexual activity: Yes  Other Topics Concern   Not on file  Social History Narrative   Not on file   Social Drivers of Health   Financial Resource Strain: Not on file  Food Insecurity: Not on file  Transportation Needs: Not on file  Physical Activity: Not on file  Stress: Not on file  Social Connections: Not on file  Intimate Partner Violence: Not on file     Review of Systems  All other systems reviewed and are negative.      Objective:   Physical Exam Vitals reviewed.  Constitutional:      General: She is not in acute distress.    Appearance: Normal appearance. She is normal weight. She is not ill-appearing, toxic-appearing or diaphoretic.  HENT:     Head: Normocephalic and atraumatic.   Cardiovascular:     Rate and Rhythm: Normal rate and regular rhythm.     Heart sounds: Normal heart  sounds. No murmur heard.    No friction rub. No gallop.  Pulmonary:     Effort: Pulmonary effort is normal. No respiratory distress.     Breath sounds: Normal breath sounds. No stridor. No wheezing, rhonchi or rales.  Chest:     Chest wall: No tenderness.  Abdominal:     General: Bowel sounds are normal.     Tenderness: There is no abdominal tenderness.   Musculoskeletal:     Right lower leg: No edema.     Left lower leg: No edema.   Skin:    General: Skin is warm.     Coloration: Skin is not jaundiced or pale.     Findings: No bruising, erythema, lesion or rash.   Neurological:     General: No focal deficit  present.     Mental Status: She is alert and oriented to person, place, and time. Mental status is at baseline.     Cranial Nerves: No cranial nerve deficit.     Sensory: No sensory deficit.     Motor: No weakness.     Coordination: Coordination normal.     Gait: Gait normal.   Psychiatric:        Mood and Affect: Mood normal.        Behavior: Behavior normal.        Thought Content: Thought content normal.        Judgment: Judgment normal.           Assessment & Plan:  Type 2 diabetes mellitus without complication, without long-term current use of insulin (HCC)  Benign essential HTN  Pure hypercholesterolemia Blood pressure today acceptable.  The bloodwork is outstanding.  Increase Ozempic  to 0.5 mg subcu weekly to facilitate additional weight loss.  Cholesterol is excellent.  Schedule patient for Cologuard.  Recommended Pneumovax 23.

## 2023-08-10 ENCOUNTER — Other Ambulatory Visit: Payer: Self-pay | Admitting: Family Medicine

## 2023-08-11 LAB — COLOGUARD: COLOGUARD: NEGATIVE

## 2023-08-11 NOTE — Progress Notes (Signed)
 Received results- No diabetic retinopathy shown. Routed exam to provider. Patient is aware of results and NO referral needed.

## 2023-08-12 ENCOUNTER — Ambulatory Visit: Payer: Self-pay | Admitting: Family Medicine

## 2023-08-31 ENCOUNTER — Other Ambulatory Visit: Payer: Self-pay | Admitting: Family Medicine

## 2023-08-31 DIAGNOSIS — F411 Generalized anxiety disorder: Secondary | ICD-10-CM

## 2023-09-09 ENCOUNTER — Other Ambulatory Visit: Payer: Self-pay | Admitting: Family Medicine

## 2023-10-08 ENCOUNTER — Other Ambulatory Visit: Payer: Self-pay | Admitting: Family Medicine

## 2023-10-09 NOTE — Telephone Encounter (Signed)
 Requested medication (s) are due for refill today:   Yes  Requested medication (s) are on the active medication list:   Yes  Future visit scheduled:   No.    LOV 07/31/2023    Last ordered: 09/11/2023 #30, 0 refills  Refill criteria met but there is a note that she needs an appt for more refills.   Not sure if this is an old note or if Dr. Duanne needs to see her again.     Requested Prescriptions  Pending Prescriptions Disp Refills   pravastatin  (PRAVACHOL ) 20 MG tablet [Pharmacy Med Name: PRAVASTATIN  SODIUM 20 MG TAB] 30 tablet 0    Sig: TAKE 1 TABLET BY MOUTH DAILY - NEED APPOINTMENT FOR MORE REFILLS     Cardiovascular:  Antilipid - Statins Failed - 10/09/2023  1:23 PM      Failed - Lipid Panel in normal range within the last 12 months    Cholesterol  Date Value Ref Range Status  07/24/2023 152 <200 mg/dL Final   LDL Cholesterol (Calc)  Date Value Ref Range Status  07/24/2023 87 mg/dL (calc) Final    Comment:    Reference range: <100 . Desirable range <100 mg/dL for primary prevention;   <70 mg/dL for patients with CHD or diabetic patients  with > or = 2 CHD risk factors. SABRA LDL-C is now calculated using the Martin-Hopkins  calculation, which is a validated novel method providing  better accuracy than the Friedewald equation in the  estimation of LDL-C.  Gladis APPLETHWAITE et al. SANDREA. 7986;689(80): 2061-2068  (http://education.[facility].com/faq/FAQ164)    HDL  Date Value Ref Range Status  07/24/2023 40 (L) > OR = 50 mg/dL Final   Triglycerides  Date Value Ref Range Status  07/24/2023 149 <150 mg/dL Final         Passed - Patient is not pregnant      Passed - Valid encounter within last 12 months    Recent Outpatient Visits           2 months ago Type 2 diabetes mellitus without complication, without long-term current use of insulin (HCC)   [place] [facility] Pickard, Butler DASEN, MD   8 months ago Prediabetes   [facility] Medicine Duanne Butler DASEN, MD   1 year ago Lower abdominal pain   [place] [facility] Kayla Jeoffrey RAMAN, FNP   1 year ago General medical exam   [place] [facility] Pickard, Butler DASEN, MD

## 2023-10-16 ENCOUNTER — Other Ambulatory Visit: Payer: Self-pay | Admitting: Family Medicine

## 2023-10-16 DIAGNOSIS — E119 Type 2 diabetes mellitus without complications: Secondary | ICD-10-CM

## 2023-11-08 ENCOUNTER — Other Ambulatory Visit: Payer: Self-pay | Admitting: Family Medicine

## 2023-11-10 NOTE — Telephone Encounter (Signed)
 Requested Prescriptions  Pending Prescriptions Disp Refills   pravastatin  (PRAVACHOL ) 20 MG tablet [Pharmacy Med Name: PRAVASTATIN  SODIUM 20 MG TAB] 90 tablet 0    Sig: TAKE 1 TABLET BY MOUTH DAILY --PLEASE MAKE APPOINTMENT FOR MORE REFILLS     Cardiovascular:  Antilipid - Statins Failed - 11/10/2023 12:52 PM      Failed - Lipid Panel in normal range within the last 12 months    Cholesterol  Date Value Ref Range Status  07/24/2023 152 <200 mg/dL Final   LDL Cholesterol (Calc)  Date Value Ref Range Status  07/24/2023 87 mg/dL (calc) Final    Comment:    Reference range: <100 . Desirable range <100 mg/dL for primary prevention;   <70 mg/dL for patients with CHD or diabetic patients  with > or = 2 CHD risk factors. SABRA LDL-C is now calculated using the Martin-Hopkins  calculation, which is a validated novel method providing  better accuracy than the Friedewald equation in the  estimation of LDL-C.  Gladis APPLETHWAITE et al. SANDREA. 7986;689(80): 2061-2068  (http://education.[facility].com/faq/FAQ164)    HDL  Date Value Ref Range Status  07/24/2023 40 (L) > OR = 50 mg/dL Final   Triglycerides  Date Value Ref Range Status  07/24/2023 149 <150 mg/dL Final         Passed - Patient is not pregnant      Passed - Valid encounter within last 12 months    Recent Outpatient Visits           3 months ago Type 2 diabetes mellitus without complication, without long-term current use of insulin (HCC)   [place] [facility] Pickard, Butler DASEN, MD   9 months ago Prediabetes   [place] [facility] Duanne Butler DASEN, MD   1 year ago Lower abdominal pain   [place] [facility] Kayla Jeoffrey RAMAN, FNP   1 year ago General medical exam   [facility] Pickard, Butler DASEN, MD

## 2023-12-27 ENCOUNTER — Other Ambulatory Visit: Payer: Self-pay | Admitting: Family Medicine

## 2023-12-27 DIAGNOSIS — I10 Essential (primary) hypertension: Secondary | ICD-10-CM

## 2023-12-29 ENCOUNTER — Other Ambulatory Visit: Payer: Self-pay

## 2023-12-29 ENCOUNTER — Encounter: Payer: Self-pay | Admitting: Family Medicine

## 2023-12-29 DIAGNOSIS — I10 Essential (primary) hypertension: Secondary | ICD-10-CM

## 2023-12-29 MED ORDER — METOPROLOL TARTRATE 50 MG PO TABS: 50.0000 mg | ORAL_TABLET | Freq: Two times a day (BID) | ORAL | 1 refills | Status: AC

## 2023-12-29 NOTE — Telephone Encounter (Signed)
 Duplicate request, refilled 12/29/23.  Requested Prescriptions  Pending Prescriptions Disp Refills   metoprolol  tartrate (LOPRESSOR ) 50 MG tablet [Pharmacy Med Name: METOPROLOL  TARTRATE 50 MG TAB] 180 tablet 1    Sig: TAKE 1 TABLET BY MOUTH 2 TIMES A DAY     Cardiovascular:  Beta Blockers Passed - 12/29/2023  4:13 PM      Passed - Last BP in normal range    BP Readings from Last 1 Encounters:  07/31/23 138/82         Passed - Last Heart Rate in normal range    Pulse Readings from Last 1 Encounters:  07/31/23 78         Passed - Valid encounter within last 6 months    Recent Outpatient Visits           5 months ago Type 2 diabetes mellitus without complication, without long-term current use of insulin (HCC)   [place] [facility] Pickard, Butler DASEN, MD   10 months ago Prediabetes   [place] [facility] Duanne Butler DASEN, MD   1 year ago Lower abdominal pain   [place] [facility] Medicine Kayla Jeoffrey RAMAN, FNP   1 year ago General medical exam    [facility] Pickard, Butler DASEN, MD

## 2024-02-10 ENCOUNTER — Other Ambulatory Visit: Payer: Self-pay | Admitting: Family Medicine

## 2024-02-25 ENCOUNTER — Other Ambulatory Visit: Payer: Self-pay | Admitting: Family Medicine

## 2024-02-25 DIAGNOSIS — F411 Generalized anxiety disorder: Secondary | ICD-10-CM

## 2024-03-02 ENCOUNTER — Telehealth: Payer: Self-pay

## 2024-03-02 NOTE — Telephone Encounter (Signed)
 Copied from CRM [id number]. Topic: Clinical - Request for Lab/Test Order >> Mar 02, 2024  4:09 PM Alfonso ORN wrote: Reason for CRM: pt requesting lab to check diabetes prior to appt.

## 2024-03-05 ENCOUNTER — Other Ambulatory Visit (HOSPITAL_COMMUNITY): Payer: Self-pay

## 2024-03-05 ENCOUNTER — Other Ambulatory Visit

## 2024-03-05 DIAGNOSIS — Z0000 Encounter for general adult medical examination without abnormal findings: Secondary | ICD-10-CM

## 2024-03-05 DIAGNOSIS — E119 Type 2 diabetes mellitus without complications: Secondary | ICD-10-CM

## 2024-03-05 DIAGNOSIS — I10 Essential (primary) hypertension: Secondary | ICD-10-CM

## 2024-03-05 DIAGNOSIS — E7800 Pure hypercholesterolemia, unspecified: Secondary | ICD-10-CM

## 2024-03-08 ENCOUNTER — Ambulatory Visit: Payer: Self-pay | Admitting: Family Medicine

## 2024-03-09 ENCOUNTER — Telehealth: Payer: Self-pay | Admitting: Pharmacy Technician

## 2024-03-09 ENCOUNTER — Other Ambulatory Visit (HOSPITAL_COMMUNITY): Payer: Self-pay

## 2024-03-09 ENCOUNTER — Encounter: Payer: Self-pay | Admitting: Family Medicine

## 2024-03-09 ENCOUNTER — Ambulatory Visit: Admitting: Family Medicine

## 2024-03-09 VITALS — BP 136/86 | HR 76 | Temp 98.5°F | Ht 63.0 in | Wt 175.0 lb

## 2024-03-09 DIAGNOSIS — Z23 Encounter for immunization: Secondary | ICD-10-CM | POA: Diagnosis not present

## 2024-03-09 DIAGNOSIS — Z7985 Long-term (current) use of injectable non-insulin antidiabetic drugs: Secondary | ICD-10-CM | POA: Diagnosis not present

## 2024-03-09 DIAGNOSIS — E119 Type 2 diabetes mellitus without complications: Secondary | ICD-10-CM | POA: Diagnosis not present

## 2024-03-09 DIAGNOSIS — I10 Essential (primary) hypertension: Secondary | ICD-10-CM

## 2024-03-09 DIAGNOSIS — E7800 Pure hypercholesterolemia, unspecified: Secondary | ICD-10-CM

## 2024-03-09 LAB — CBC WITH DIFFERENTIAL/PLATELET
Absolute Lymphocytes: 3294 {cells}/uL (ref 850–3900)
Absolute Monocytes: 637 {cells}/uL (ref 200–950)
Basophils Absolute: 64 {cells}/uL (ref 0–200)
Basophils Relative: 0.7 %
Eosinophils Absolute: 73 {cells}/uL (ref 15–500)
Eosinophils Relative: 0.8 %
HCT: 44.2 % (ref 35.9–46.0)
Hemoglobin: 14.5 g/dL (ref 11.7–15.5)
MCH: 31.6 pg (ref 27.0–33.0)
MCHC: 32.8 g/dL (ref 31.6–35.4)
MCV: 96.3 fL (ref 81.4–101.7)
MPV: 9.4 fL (ref 7.5–12.5)
Monocytes Relative: 7 %
Neutro Abs: 5032 {cells}/uL (ref 1500–7800)
Neutrophils Relative %: 55.3 %
Platelets: 322 Thousand/uL (ref 140–400)
RBC: 4.59 Million/uL (ref 3.80–5.10)
RDW: 12.1 % (ref 11.0–15.0)
Total Lymphocyte: 36.2 %
WBC: 9.1 Thousand/uL (ref 3.8–10.8)

## 2024-03-09 LAB — COMPLETE METABOLIC PANEL WITHOUT GFR
AG Ratio: 1.8 (calc) (ref 1.0–2.5)
ALT: 27 U/L (ref 6–29)
AST: 17 U/L (ref 10–35)
Albumin: 4.4 g/dL (ref 3.6–5.1)
Alkaline phosphatase (APISO): 71 U/L (ref 37–153)
BUN: 15 mg/dL (ref 7–25)
CO2: 31 mmol/L (ref 20–32)
Calcium: 9.2 mg/dL (ref 8.6–10.4)
Chloride: 101 mmol/L (ref 98–110)
Creat: 0.58 mg/dL (ref 0.50–1.05)
Globulin: 2.5 g/dL (ref 1.9–3.7)
Glucose, Bld: 96 mg/dL (ref 65–99)
Potassium: 4.6 mmol/L (ref 3.5–5.3)
Sodium: 138 mmol/L (ref 135–146)
Total Bilirubin: 1.2 mg/dL (ref 0.2–1.2)
Total Protein: 6.9 g/dL (ref 6.1–8.1)

## 2024-03-09 LAB — MICROALBUMIN / CREATININE URINE RATIO
Creatinine, Urine: 66 mg/dL (ref 20–275)
Microalb Creat Ratio: 5 mg/g{creat}
Microalb, Ur: 0.3 mg/dL

## 2024-03-09 LAB — LIPID PANEL
Cholesterol: 152 mg/dL
HDL: 39 mg/dL — ABNORMAL LOW
LDL Cholesterol (Calc): 88 mg/dL
Non-HDL Cholesterol (Calc): 113 mg/dL
Total CHOL/HDL Ratio: 3.9 (calc)
Triglycerides: 155 mg/dL — ABNORMAL HIGH

## 2024-03-09 LAB — VITAMIN B12: Vitamin B-12: 494 pg/mL (ref 200–1100)

## 2024-03-09 LAB — HEMOGLOBIN A1C
Hgb A1c MFr Bld: 6 % — ABNORMAL HIGH
Mean Plasma Glucose: 126 mg/dL
eAG (mmol/L): 7 mmol/L

## 2024-03-09 MED ORDER — SEMAGLUTIDE (1 MG/DOSE) 4 MG/3ML ~~LOC~~ SOPN: 1.0000 mg | PEN_INJECTOR | SUBCUTANEOUS | 3 refills | Status: AC

## 2024-03-09 NOTE — Telephone Encounter (Signed)
 Pharmacy Patient Advocate Encounter   Received notification from Onbase CMM KEY that prior authorization for  OZEMPIC  1 MG/DOSE (4 MG/3 ML) is required/requested.   Insurance verification completed.   The patient is insured through [facility].   Per test claim: PA required; PA submitted to above mentioned insurance via Prompt PA Key/confirmation #/EOC [id number] Status is pending

## 2024-03-09 NOTE — Progress Notes (Signed)
 "  Subjective:    Patient ID: [name], female    DOB: October 26, 1965, 59 y.o.   MRN: [id number]  Patient is a very sweet 59 year old Caucasian female who presents today for a follow-up of her diabetes.  Her most recent lab work is listed below and her A1c is outstanding at 6.0.  Her LDL cholesterol is acceptable.  There is a mild elevation in her triglycerides that I do not feel is clinically significant.  Her HDL is chronically low and we discussed aerobic exercise to help address that.  She is due for a flu shot which she declines.  She is also due for the pneumonia vaccine which she agrees to.  She denies any chest pain shortness of breath or dyspnea on exertion.  Diabetic foot exam was performed today and is normal.  Patient's mammogram was performed by her gynecologist and is up-to-date.  She has a history of a hysterectomy and therefore does not require Pap smear.  Her Cologuard was performed in 2025 and is up-to-date Lab on 03/05/2024  Component Date Value Ref Range Status   WBC 03/05/2024 9.1  3.8 - 10.8 Thousand/uL Final   RBC 03/05/2024 4.59  3.80 - 5.10 Million/uL Final   Hemoglobin 03/05/2024 14.5  11.7 - 15.5 g/dL Final   HCT 98/83/7973 44.2  35.9 - 46.0 % Final   MCV 03/05/2024 96.3  81.4 - 101.7 fL Final   MCH 03/05/2024 31.6  27.0 - 33.0 pg Final   MCHC 03/05/2024 32.8  31.6 - 35.4 g/dL Final   RDW 98/83/7973 12.1  11.0 - 15.0 % Final   Platelets 03/05/2024 322  140 - 400 Thousand/uL Final   MPV 03/05/2024 9.4  7.5 - 12.5 fL Final   Neutro Abs 03/05/2024 5,032  1,500 - 7,800 cells/uL Final   Absolute Lymphocytes 03/05/2024 3,294  850 - 3,900 cells/uL Final   Absolute Monocytes 03/05/2024 637  200 - 950 cells/uL Final   Eosinophils Absolute 03/05/2024 73  15 - 500 cells/uL Final   Basophils Absolute 03/05/2024 64  0 - 200 cells/uL Final   Neutrophils Relative % 03/05/2024 55.3  % Final   Total Lymphocyte 03/05/2024 36.2  % Final   Monocytes Relative  03/05/2024 7.0  % Final   Eosinophils Relative 03/05/2024 0.8  % Final   Basophils Relative 03/05/2024 0.7  % Final   Glucose, Bld 03/05/2024 96  65 - 99 mg/dL Final   Comment: .            Fasting reference interval .    BUN 03/05/2024 15  7 - 25 mg/dL Final   Creat 98/83/7973 0.58  0.50 - 1.05 mg/dL Final   BUN/Creatinine Ratio 03/05/2024 SEE NOTE:  6 - 22 (calc) Final   Comment:    Not Reported: BUN and Creatinine are within    reference range. .    Sodium 03/05/2024 138  135 - 146 mmol/L Final   Potassium 03/05/2024 4.6  3.5 - 5.3 mmol/L Final   Chloride 03/05/2024 101  98 - 110 mmol/L Final   CO2 03/05/2024 31  20 - 32 mmol/L Final   Calcium 03/05/2024 9.2  8.6 - 10.4 mg/dL Final   Total Protein 98/83/7973 6.9  6.1 - 8.1 g/dL Final   Albumin 98/83/7973 4.4  3.6 - 5.1 g/dL Final   Globulin 98/83/7973 2.5  1.9 - 3.7 g/dL (calc) Final   AG Ratio 03/05/2024 1.8  1.0 - 2.5 (calc) Final   Total Bilirubin 03/05/2024 1.2  0.2 - 1.2 mg/dL Final   Alkaline phosphatase (APISO) 03/05/2024 71  37 - 153 U/L Final   AST 03/05/2024 17  10 - 35 U/L Final   ALT 03/05/2024 27  6 - 29 U/L Final   Hgb A1c MFr Bld 03/05/2024 6.0 (H)  <5.7 % Final   Comment: For someone without known diabetes, a hemoglobin  A1c value between 5.7% and 6.4% is consistent with prediabetes and should be confirmed with a  follow-up test. . For someone with known diabetes, a value <7% indicates that their diabetes is well controlled. A1c targets should be individualized based on duration of diabetes, age, comorbid conditions, and other considerations. . This assay result is consistent with an increased risk of diabetes. . Currently, no consensus exists regarding use of hemoglobin A1c for diagnosis of diabetes for children. .    Mean Plasma Glucose 03/05/2024 126  mg/dL Final   eAG (mmol/L) 98/83/7973 7.0  mmol/L Final   Cholesterol 03/05/2024 152  <200 mg/dL Final   HDL 98/83/7973 39  (L)  > OR = 50 mg/dL Final   Triglycerides 98/83/7973 155 (H)  <150 mg/dL Final   LDL Cholesterol (Calc) 03/05/2024 88  mg/dL (calc) Final   Comment: Reference range: <100 . Desirable range <100 mg/dL for primary prevention;   <70 mg/dL for patients with CHD or diabetic patients  with > or = 2 CHD risk factors. SABRA LDL-C is now calculated using the Martin-Hopkins  calculation, which is a validated novel method providing  better accuracy than the Friedewald equation in the  estimation of LDL-C.  Gladis APPLETHWAITE et al. SANDREA. 7986;689(80): 2061-2068  (http://education.[facility].com/faq/FAQ164)    Total CHOL/HDL Ratio 03/05/2024 3.9  <4.9 (calc) Final   Non-HDL Cholesterol (Calc) 03/05/2024 113  <130 mg/dL (calc) Final   Comment: For patients with diabetes plus 1 major ASCVD risk  factor, treating to a non-HDL-C goal of <100 mg/dL  (LDL-C of <29 mg/dL) is considered a therapeutic  option.    Creatinine, Urine 03/05/2024 66  20 - 275 mg/dL Final   Microalb, Ur 98/83/7973 0.3  mg/dL Final   Comment: Reference Range Not established    Microalb Creat Ratio 03/05/2024 5  <30 mg/g creat Final   Comment: . The ADA defines abnormalities in albumin excretion as follows: SABRA Albuminuria Category        Result (mg/g creatinine) . Normal to Mildly increased   <30 Moderately increased         30-299  Severely increased           > OR = 300 . The ADA recommends that at least two of three specimens collected within a 3-6 month period be abnormal before considering a patient to be within a diagnostic category.    Vitamin B-12 03/05/2024 494  200 - 1,100 pg/mL Final   She is overdue for colonoscopy.  She would like to do Cologuard.  Her mammogram is inform her gynecologist.  She is due for pneumonia vaccine.  She defers this at the present time. Past Medical History:  Diagnosis Date   Diabetes mellitus without complication (HCC)    Hyperlipidemia    Hypertension    Metabolic  syndrome    PONV (postoperative nausea and vomiting)    nausea even after zofran  and decadron    Tracheal stenosis    Past Surgical History:  Procedure Laterality Date   ADENOIDECTOMY     age 3   bladder sling surgery x 2     CLEFT  PALATE REPAIR     infant   OTHER SURGICAL HISTORY  05/2014, 09/2014   Tracheal bronchoscopy and dilation - performed by Dr. Garnette Silvan at [facility]   ROBOTIC ASSISTED BILATERAL SALPINGO OOPHERECTOMY Bilateral 01/31/2015   Procedure: ROBOTIC ASSISTED BILATERAL SALPINGO OOPHORECTOMY AND POSSIBLE LYSIS OF ADHESIONS;  Surgeon: Maurilio Ship, MD;  Location: [facility];  Service: Gynecology;  Laterality: Bilateral;   TONSILLECTOMY     age 50   trachea laser and dilation x 2     trachea stenosis   TUBAL LIGATION     1992   tubes in ears as child     VAGINAL HYSTERECTOMY     partial 1995   Current Outpatient Medications on File Prior to Visit  Medication Sig Dispense Refill   ALPRAZolam  (XANAX ) 0.5 MG tablet Take 1 tablet (0.5 mg total) by mouth 3 (three) times daily as needed. 30 tablet 0   Ascorbic Acid (VITAMIN C) 1000 MG tablet Take 1,000 mg by mouth daily.     cholecalciferol (VITAMIN D3) 25 MCG (1000 UNIT) tablet Take 1,000 Units by mouth daily.     escitalopram  (LEXAPRO ) 10 MG tablet TAKE 1 TABLET BY MOUTH DAILY 90 tablet 1   fexofenadine  (ALLEGRA ) 180 MG tablet Take 1 tablet (180 mg total) by mouth daily. 30 tablet 11   fluticasone  (FLONASE ) 50 MCG/ACT nasal spray SPRAY TWO SPRAYS IN EACH NOSTRIL ONCE DAILY 16 g 6   hydrochlorothiazide  (HYDRODIURIL ) 25 MG tablet Take 1 tablet (25 mg total) by mouth daily. 90 tablet 3   Krill Oil 500 MG CAPS Take 500 mg by mouth daily.     losartan  (COZAAR ) 50 MG tablet TAKE 1 TABLET BY MOUTH TWICE A DAY 180 tablet 3   metoprolol  tartrate (LOPRESSOR ) 50 MG tablet Take 1 tablet (50 mg total) by mouth 2 (two) times daily. 180 tablet 1   pravastatin  (PRAVACHOL ) 20 MG tablet TAKE 1 TABLET BY MOUTH DAILY;  **MUST CALL MD FOR APPOINTMENT FOR FUTURE REFILLS ! 90 tablet 0   Probiotic Product (PROBIOTIC ADVANCED PO) Take by mouth.     No current facility-administered medications on file prior to visit.   Allergies  Allergen Reactions   Niacin And Related Other (See Comments)    Passes out   Social History   Socioeconomic History   Marital status: Married    Spouse name: Not on file   Number of children: 2   Years of education: Not on file   Highest education level: Not on file  Occupational History   Occupation: print production planner  Tobacco Use   Smoking status: Never   Smokeless tobacco: Never  Vaping Use   Vaping status: Never Used  Substance and Sexual Activity   Alcohol use: Yes    Alcohol/week: 0.0 standard drinks of alcohol    Comment: 1-2 times in one year    Drug use: No   Sexual activity: Yes  Other Topics Concern   Not on file  Social History Narrative   Not on file   Social Drivers of Health   Tobacco Use: Low Risk (03/09/2024)   Patient History    Smoking Tobacco Use: Never    Smokeless Tobacco Use: Never    Passive Exposure: Not on file  Financial Resource Strain: Not on file  Food Insecurity: Not on file  Transportation Needs: Not on file  Physical Activity: Not on file  Stress: Not on file  Social Connections: Not on file  Intimate Partner Violence: Not on file  Depression (PHQ2-9): Low Risk (04/18/2022)   Depression (PHQ2-9)    PHQ-2 Score: 0  Alcohol Screen: Not on file  Housing: Not on file  Utilities: Not on file  Health Literacy: Not on file     Review of Systems  All other systems reviewed and are negative.      Objective:   Physical Exam Vitals reviewed.  Constitutional:      General: She is not in acute distress.    Appearance: Normal appearance. She is normal weight. She is not ill-appearing, toxic-appearing or diaphoretic.  HENT:     Head: Normocephalic and atraumatic.  Cardiovascular:     Rate and Rhythm: Normal  rate and regular rhythm.     Heart sounds: Normal heart sounds. No murmur heard.    No friction rub. No gallop.  Pulmonary:     Effort: Pulmonary effort is normal. No respiratory distress.     Breath sounds: Normal breath sounds. No stridor. No wheezing, rhonchi or rales.  Chest:     Chest wall: No tenderness.  Abdominal:     General: Bowel sounds are normal.     Tenderness: There is no abdominal tenderness.  Musculoskeletal:     Right lower leg: No edema.     Left lower leg: No edema.  Skin:    General: Skin is warm.     Coloration: Skin is not jaundiced or pale.     Findings: No bruising, erythema, lesion or rash.  Neurological:     General: No focal deficit present.     Mental Status: She is alert and oriented to person, place, and time. Mental status is at baseline.     Cranial Nerves: No cranial nerve deficit.     Sensory: No sensory deficit.     Motor: No weakness.     Coordination: Coordination normal.     Gait: Gait normal.  Psychiatric:        Mood and Affect: Mood normal.        Behavior: Behavior normal.        Thought Content: Thought content normal.        Judgment: Judgment normal.           Assessment & Plan:  Need for vaccination - Plan: Pneumococcal conjugate vaccine 20-valent (Prevnar 20)  Benign essential HTN  Type 2 diabetes mellitus without complication, without long-term current use of insulin (HCC)  Pure hypercholesterolemia Diabetes is well-controlled but we have decided to increase Ozempic  to 1 mg subcu weekly to try to facilitate additional weight loss.  Her BMI today is 30.  Ideally I would like to see her lose 10 more pounds to get down to 165 which would give her a BMI of 28.  Patient received the pneumonia vaccine today.  She declined the flu shot.  Blood pressure is up-to-date and cholesterol is acceptable "

## 2024-03-10 ENCOUNTER — Other Ambulatory Visit (HOSPITAL_COMMUNITY): Payer: Self-pay

## 2024-03-10 NOTE — Telephone Encounter (Signed)
 Pharmacy Patient Advocate Encounter  Received notification from [employer] that Prior Authorization for Ozempic  1 mg/dose has been APPROVED from 03/09/24 to 03/08/25. Ran test claim, Copay is $35.00. This test claim was processed through [facility]- copay amounts may vary at other pharmacies due to pharmacy/plan contracts, or as the patient moves through the different stages of their insurance plan.   PA #/Case ID/Reference #: [id number]

## 2024-03-11 ENCOUNTER — Other Ambulatory Visit (HOSPITAL_COMMUNITY): Payer: Self-pay

## 2024-03-16 ENCOUNTER — Other Ambulatory Visit (HOSPITAL_COMMUNITY): Payer: Self-pay
